# Patient Record
Sex: Female | Born: 1945
Health system: Southern US, Community
[De-identification: ages and names within clinical notes are randomized; demographics above are authoritative.]

## PROBLEM LIST (undated history)

## (undated) DIAGNOSIS — K449 Diaphragmatic hernia without obstruction or gangrene: Secondary | ICD-10-CM

## (undated) DIAGNOSIS — E785 Hyperlipidemia, unspecified: Secondary | ICD-10-CM

## (undated) DIAGNOSIS — F32A Depression, unspecified: Secondary | ICD-10-CM

## (undated) DIAGNOSIS — E039 Hypothyroidism, unspecified: Secondary | ICD-10-CM

## (undated) DIAGNOSIS — M1990 Unspecified osteoarthritis, unspecified site: Secondary | ICD-10-CM

## (undated) DIAGNOSIS — F329 Major depressive disorder, single episode, unspecified: Secondary | ICD-10-CM

## (undated) DIAGNOSIS — E079 Disorder of thyroid, unspecified: Secondary | ICD-10-CM

## (undated) HISTORY — DX: Disorder of thyroid, unspecified: E07.9

## (undated) HISTORY — DX: Major depressive disorder, single episode, unspecified: F32.9

## (undated) HISTORY — DX: Depression, unspecified: F32.A

## (undated) HISTORY — PX: TUBAL LIGATION: SHX77

## (undated) HISTORY — DX: Hyperlipidemia, unspecified: E78.5

## (undated) HISTORY — PX: NASAL SEPTUM SURGERY: SHX37

## (undated) HISTORY — PX: ROTATOR CUFF REPAIR: SHX139

---

## 1998-08-06 ENCOUNTER — Other Ambulatory Visit: Admission: RE | Admit: 1998-08-06 | Discharge: 1998-08-06 | Payer: Self-pay | Admitting: Internal Medicine

## 2000-03-16 ENCOUNTER — Encounter: Payer: Self-pay | Admitting: Internal Medicine

## 2000-03-16 ENCOUNTER — Encounter: Admission: RE | Admit: 2000-03-16 | Discharge: 2000-03-16 | Payer: Self-pay | Admitting: Internal Medicine

## 2000-06-19 ENCOUNTER — Encounter: Payer: Self-pay | Admitting: Internal Medicine

## 2000-06-19 ENCOUNTER — Encounter: Admission: RE | Admit: 2000-06-19 | Discharge: 2000-06-19 | Payer: Self-pay | Admitting: Internal Medicine

## 2001-10-27 ENCOUNTER — Emergency Department (HOSPITAL_COMMUNITY): Admission: EM | Admit: 2001-10-27 | Discharge: 2001-10-27 | Payer: Self-pay | Admitting: Emergency Medicine

## 2003-08-06 ENCOUNTER — Other Ambulatory Visit: Admission: RE | Admit: 2003-08-06 | Discharge: 2003-08-06 | Payer: Self-pay | Admitting: *Deleted

## 2004-08-10 ENCOUNTER — Other Ambulatory Visit: Admission: RE | Admit: 2004-08-10 | Discharge: 2004-08-10 | Payer: Self-pay | Admitting: Internal Medicine

## 2005-03-21 ENCOUNTER — Emergency Department (HOSPITAL_COMMUNITY): Admission: EM | Admit: 2005-03-21 | Discharge: 2005-03-21 | Payer: Self-pay | Admitting: Emergency Medicine

## 2005-10-31 ENCOUNTER — Other Ambulatory Visit: Admission: RE | Admit: 2005-10-31 | Discharge: 2005-10-31 | Payer: Self-pay | Admitting: Internal Medicine

## 2005-12-30 ENCOUNTER — Emergency Department (HOSPITAL_COMMUNITY): Admission: EM | Admit: 2005-12-30 | Discharge: 2005-12-30 | Payer: Self-pay | Admitting: Family Medicine

## 2006-03-14 ENCOUNTER — Encounter: Admission: RE | Admit: 2006-03-14 | Discharge: 2006-03-14 | Payer: Self-pay | Admitting: Specialist

## 2006-04-11 ENCOUNTER — Encounter: Admission: RE | Admit: 2006-04-11 | Discharge: 2006-04-11 | Payer: Self-pay | Admitting: Specialist

## 2006-04-25 ENCOUNTER — Encounter: Admission: RE | Admit: 2006-04-25 | Discharge: 2006-04-25 | Payer: Self-pay | Admitting: Specialist

## 2007-07-25 ENCOUNTER — Other Ambulatory Visit: Admission: RE | Admit: 2007-07-25 | Discharge: 2007-07-25 | Payer: Self-pay | Admitting: Family Medicine

## 2008-03-17 ENCOUNTER — Ambulatory Visit (HOSPITAL_COMMUNITY): Admission: RE | Admit: 2008-03-17 | Discharge: 2008-03-18 | Payer: Self-pay | Admitting: Specialist

## 2010-02-04 ENCOUNTER — Emergency Department (HOSPITAL_COMMUNITY)
Admission: EM | Admit: 2010-02-04 | Discharge: 2010-02-04 | Payer: Self-pay | Source: Home / Self Care | Admitting: Emergency Medicine

## 2010-05-24 LAB — GLUCOSE, CAPILLARY: Glucose-Capillary: 141 mg/dL — ABNORMAL HIGH (ref 70–99)

## 2010-05-24 LAB — PROTIME-INR
INR: 1 (ref 0.00–1.49)
Prothrombin Time: 12.8 seconds (ref 11.6–15.2)

## 2010-05-24 LAB — CBC
HCT: 33.4 % — ABNORMAL LOW (ref 36.0–46.0)
Hemoglobin: 12.9 g/dL (ref 12.0–15.0)
MCV: 91.9 fL (ref 78.0–100.0)
Platelets: 243 10*3/uL (ref 150–400)
RBC: 3.64 MIL/uL — ABNORMAL LOW (ref 3.87–5.11)
RDW: 13.5 % (ref 11.5–15.5)
RDW: 13.9 % (ref 11.5–15.5)
WBC: 7 10*3/uL (ref 4.0–10.5)

## 2010-05-24 LAB — APTT: aPTT: 33 seconds (ref 24–37)

## 2010-05-24 LAB — BASIC METABOLIC PANEL
CO2: 28 mEq/L (ref 19–32)
Creatinine, Ser: 0.7 mg/dL (ref 0.4–1.2)
Glucose, Bld: 99 mg/dL (ref 70–99)

## 2010-05-24 LAB — DIFFERENTIAL
Basophils Absolute: 0 10*3/uL (ref 0.0–0.1)
Eosinophils Absolute: 0.2 10*3/uL (ref 0.0–0.7)
Eosinophils Relative: 2 % (ref 0–5)
Lymphocytes Relative: 37 % (ref 12–46)
Monocytes Absolute: 0.5 10*3/uL (ref 0.1–1.0)

## 2010-06-21 NOTE — Op Note (Signed)
Megan, Craig               ACCOUNT NO.:  [id number]   MEDICAL RECORD NO.:  [id number]          PATIENT TYPE:  OIB   LOCATION:  5007                         FACILITY:  [facility]   PHYSICIAN:  [name], M.D.   DATE OF BIRTH:  1945-02-19   DATE OF PROCEDURE:  03/17/2008  DATE OF DISCHARGE:                               OPERATIVE REPORT   PREOPERATIVE DIAGNOSES:  Complete right shoulder rotator cuff tear.  Osteoarthritis, right acromioclavicular joint.   POSTOPERATIVE DIAGNOSES:  Complete rotator cuff tear, right side with  supraspinatus and superior one-third infraspinatus involved with  significant retraction of greater 2 cm.   PROCEDURE:  Open right shoulder rotator cuff tear repair using double  vested suture trough technique with 2 Mitek anchors.  Acromioplasty open  and right open distal clavicle resection.   SURGEON:  [name], MD   ASSISTANT:  [name], PA-C   ANESTHESIA:  General via orotracheal intubation by Dr. Sondra Come, anterior  scalene block performed in the preop holding area.  The patient had  local anesthetic provided at the end of the case using Marcaine 0.5%  with 1:200,000 epinephrine, 10 mL.   FINDINGS:  Degenerative joint disease, right AC joint with spur over the  distal clavicle, complete rotator cuff tear involving supraspinatus and  upper one-third of the infraspinatus tendon, attachment of the greater  tuberosity with retraction of over 2 cm.   SPECIMENS:  None.   FINDINGS:  As above.   ESTIMATED BLOOD LOSS:  50 mL.   COMPLICATIONS:  None.   The patient returned to the PACU in good condition.   HISTORY OF PRESENT ILLNESS:  The patient is a 65 year old female who in  December fell landing against her right shoulder sustaining an acute  weakness in the right upper extremity after initial treatment  conservatively with sling.  She has some return of ability to move the  arm, but persistent pain, discomfort and persistent  weakness in shoulder  abduction.  She underwent MRI scan after initial attempts were  unsuccessful in relieving her pain as she required the use of stronger  antiinflammatory agents.  MRI scan showed complete rotator cuff tear  involving supraspinatus and infraspinatus with retraction, appearance of  some chronic cuff tear as well with distal clavicle spur and AC  osteoarthritis changes.  The patient was brought to the operating room  to undergo right shoulder rotator cuff repair for acute sudden onset of  weakness associated with the injury.   INTRAOPERATIVE FINDINGS:  As above.   DESCRIPTION OF PROCEDURE:  The patient in the preop holding area had  standard protocol, marking the right arm and extremity to undergo  surgery.  She had standard preoperative antibiotics of Ancef.  Intraoperative time-out was carried out.  The patient in a beach-chair  position supine following institution of general anesthetic.  All  pressure points were well padded.  The head and the upper torso within  the Schlein shoulder frame.  The patient had PAS stockings  intraoperatively.  Time-out carried out identifying the patient,  procedure to be performed and participants.  Right upper extremity was  prepped from the wrist circumferentially about the forearm, arm,  axillary area over the superior shoulder and clavicle, over the anterior  chest wall and over the lateral aspect of the neck.  Draped in the usual  manner, iodine Vi-drape was used.  Initial incision was over the distal  clavicle and extended over the superior aspect of the acromion process  at the [place] joint approximately 2 cm in length through the skin and  subcutaneous layers directly down to the distal clavicle and AC joint.  Electrocautery then used to perform subperiosteal dissection both  anteriorly and posteriorly about the distal clavicle and then Senn  retractors flat portion, but then placed beneath the distal clavicle and  an  oscillating saw used to divide the clavicle obliquely anterior to  posterior about a centimeter to a centimeter and a half from the distal  clavicle and AC joint.  Clavicle remained stable.  Ligaments were  preserved.  With the elevation of the arm, there was no evidence or  narrowing of the joint and at this point, no significant impingement  within the [facility] joint and elevation of the arm present.  The anterior and  posterior aspects of the osteotomy were then carefully trimmed using a  rongeur.  Bone wax applied to the bleeding cancellous bone surfaces of  the distal clavicle.  Irrigation was then carried out and the periosteum  then reapproximated over the end of the clavicle and AC joint with  interrupted 0 Vicryl sutures, subcu layers approximated with interrupted  2-0 Vicryl sutures and the skin closed with the running subcu stitch of  4-0 Vicryl.  Attention then turned to the distal aspect of the acromion  process and an incision was then made over the anterior one-third of the  insertion of the deltoid muscle into the acromion process.  This  incision was carried proximally just at the anterolateral aspect of the  acromion process in line with the deltoid fibers through the skin and  subcutaneous layers approximately an inch and a half to 2 inches in  length down to the superior aspect of the acromion process, the  attachment of the deltoid tendon here and continued distally.  A 5-cm  ruler used to assess the incision ensuring the development distal to 5  cm  from the acromion process tip.  Subperiosteal dissection then  carried out over the insertion into the acromion process with the  deltoid fibers and lined with the anterior one-third raphae within the  muscle fibers.  The incision made through the fibers and subdeltoid  bursa where immediate synovial fluid was encountered.  The anterior  aspect of the acromion process was exposed and this goes out laterally  when the patient had  downsloping acromion process present.  Due to the  nature of the acromion process being type 2 without a great deal of  spurring anteriorly, anterior resection was performed of only about 5-6  mm of acromion process, anteriorly beveled proximally using oscillating  saw.  This was continued out laterally.  Then high speed bur used to bur  the undersurface of the acromion process and smoothing this area and  smoothing it laterally resecting downsloping portion of the acromion  process laterally.  A Weitlaner retractor was inserted in the  subacromial bursa, resected using Metzenbaum scissors and Mayo scissors  where necessary.  Bleeders were controlled using electrocautery.  Large  rotator cuff tear was identified.  There was a small band of  tissue  still attached to the greater trochanter extending along the  supraspinatus.  Much of the supraspinatus over its inferior aspect was  still attached about two-thirds, upper one-third was detached and  retracted posteriorly.  Supraspinatus was retracted posteriorly.  Kocher  clamps were then used to grasp the leading edge of the torn rotator cuff  and supraspinatus and infraspinatus.  This was then placed under tension  of finger dissection, then used to free up scar tissue over the  posterior aspect of the rotator cuff freeing this up and allowing for  further lengthening.  Shoulder with internal and external rotation and  identification of the biceps tendon within its normal groove identified.  At the sulcus between the greater tuberosity and the lateral aspect of  the humeral head and the trough was made using 0.75-inch curved  osteotome, the trough depth was about half of a centimeter depth.  High  speed bur was then used carefully to remove cartilaginous bone then  along the superior surface of the trough over an area of about 3-4 mm  for insertion of Mitek anchors and for making a bed of bleeding bone  surface that the rotator cuff would  reattach itself to.  This completed,  then #1 Ethibond sutures were then inserted and Tajima a modified  Kessler-like sutures of the leading edge of the torn rotator cuff.  A  total of 4 sutures were able to be placed into the supraspinatus and  infraspinatus leading edge of torn rotator cuff.  Using the concept  tray, awls were then used to make openings into the lateral aspect of  the proximal humerus about 6-7 cm below the tip of the greater  trochanter of the most anterior just lateral to the patient's bicipital  groove.  A total of 3 openings into the lateral aspect of the proximal  greater tuberosity were performed and these were placed into the trough  using Lewin-like clamp completing the connection between the small  openings of the lateral proximal humerus and the trough.  Mitek anchors  #2 were then placed using the drill, which was placed over subchondral  bone over the lateral aspect of the humeral head superiorly just medial  to the trough at about 3-4 mm.  This was placed over the leading aspect  of the area where cartilage had been removed and a subchondral bone  surface preserved.  With the rotator cuff under traction and then the  Mitek anchors sutures and #2 sutures were then passed through the  tendinous portion of the supraspinatus about a centimeter and a half  proximal to a leading edge of the torn cuff.  This was done both of the  anterior and posterior Mitek anchors and these were tagged.  At this  point, the leading edge sutures were then passed using the suture passer  the most anterior of the sutures were passed through soft tissue over  the anterior aspect of the rotator cuff capturing the ligament over the  patient's bicipital groove.  Two sutures were then passed through each  of the holes for each of the sutures provided and each of the sutures  maintained in the correct alignment laterally.  The arm was then  abducted and externally rotated and the anterior  suture placed under  tension and tied into place and the second with the suture retraction  then provided for the posterior aspect of the cuff.  This delivered the  anterior aspect of the rotator cuff torn  surface into the trough that  was made and sutured into place.  Once completion of the 4 sutures tying  was present and the last suture was passed through soft tissue and  remaining portion of the cuff posteriorly and sutured into place.  The  Mitek anchors were then each individually tied down capturing the  proximal portion of the cuff repair site in 2 areas both anterior and  posterior, one for supraspinatus and one for infraspinatus.  With this  completed then the anterior sutures over the trough distally were then  approximated to one another, sutured to each other as were the  posterior, then these were sutured to one another anterior and posterior  completing the repair laterally.  Rotation of the shoulder internal and  external rotation, abduction showed no impingement present.  The patient  did have a tight repair, though presence of an abduction pillow was  ordered.  Irrigation was then carried out and the patient then had  closure of the incision site for the acromioplasty and rotator cuff  repair by approximating the tendineus portions of the deltoid over this  acromion process using #1 Ethibond sutures.  Superficial fascial layer  of the deltoid approximated with interrupted 0 and 2-0 Vicryl sutures.  Subcu layers approximated with interrupted 2-0 Vicryl sutures and skin  closed with a running subcu stitch with 4-0 Vicryl.  Dermabond was then  applied to the skin edges above the distal clavicle resection site as  well as the area of the rotator cuff repair.  Dry dressing, 4 x 4s fixed  in place with Hypafix tape.  The patient was then placed into DonJoy  abduction pillow after infiltration of the skin site and placement of  dressing.  No good infiltration was carried out  prior to placing of  dressing and the Dermabond using 0.5% Marcaine with 1:200,000  epinephrine.  The subacromial space was also infiltrated.  After  dressing was applied and abduction pillow applied, the patient was then  returned to her bed, reactivated, extubated and returned to recovery  room in satisfactory condition.  All instruments, sponge counts were  correct.      [name], M.D.  Electronically Signed     JEN/MEDQ  D:  03/17/2008  T:  03/18/2008  Job:  [id number]

## 2012-12-31 ENCOUNTER — Ambulatory Visit: Payer: Self-pay | Admitting: Podiatry

## 2012-12-31 ENCOUNTER — Encounter: Payer: Self-pay | Admitting: Podiatry

## 2015-10-08 ENCOUNTER — Other Ambulatory Visit: Payer: Self-pay | Admitting: Gastroenterology

## 2015-10-08 DIAGNOSIS — R1013 Epigastric pain: Secondary | ICD-10-CM

## 2015-10-21 ENCOUNTER — Ambulatory Visit
Admission: RE | Admit: 2015-10-21 | Discharge: 2015-10-21 | Disposition: A | Discharge status: Home / Self Care | Payer: BLUE CROSS/BLUE SHIELD | Source: Ambulatory Visit | Attending: Gastroenterology | Admitting: Gastroenterology

## 2015-10-21 DIAGNOSIS — R1013 Epigastric pain: Secondary | ICD-10-CM

## 2016-09-14 ENCOUNTER — Encounter: Payer: Self-pay | Admitting: Podiatry

## 2016-09-14 ENCOUNTER — Ambulatory Visit (INDEPENDENT_AMBULATORY_CARE_PROVIDER_SITE_OTHER): Payer: BLUE CROSS/BLUE SHIELD

## 2016-09-14 ENCOUNTER — Ambulatory Visit (INDEPENDENT_AMBULATORY_CARE_PROVIDER_SITE_OTHER): Payer: BLUE CROSS/BLUE SHIELD | Admitting: Podiatry

## 2016-09-14 VITALS — BP 139/72 | HR 71 | Resp 16

## 2016-09-14 DIAGNOSIS — M2012 Hallux valgus (acquired), left foot: Secondary | ICD-10-CM | POA: Diagnosis not present

## 2016-09-14 DIAGNOSIS — M2042 Other hammer toe(s) (acquired), left foot: Secondary | ICD-10-CM | POA: Diagnosis not present

## 2016-09-14 DIAGNOSIS — M2011 Hallux valgus (acquired), right foot: Secondary | ICD-10-CM

## 2016-09-14 DIAGNOSIS — M2041 Other hammer toe(s) (acquired), right foot: Secondary | ICD-10-CM

## 2016-09-14 DIAGNOSIS — M205X1 Other deformities of toe(s) (acquired), right foot: Secondary | ICD-10-CM

## 2016-09-14 DIAGNOSIS — M205X2 Other deformities of toe(s) (acquired), left foot: Secondary | ICD-10-CM | POA: Diagnosis not present

## 2016-09-14 NOTE — Progress Notes (Signed)
  Subjective:  @Patient  ID: Megan Craig, female    DOB: January 12, 1946, 71 y.o.   MRN: [id number]  HPI  Chief Complaint  Patient presents with  . Toe Pain    2nd toes bilateral - left 2nd overlaps hallux, right 2nd swells and stays red, shoes irritate, previous bunion surgery on left    71 year old female with history of L Bunion and 2nd toe surgery 10 years ago (unsure of [employer]). Only has pain with the toe irritated by shoes. Reports multiple calluses to both feet for which she attempts self-care.   has a past medical history of Depression; Hyperlipidemia; and Thyroid disease.   has a past surgical history that includes Rotator cuff repair.  Current Outpatient Prescriptions:  .  buPROPion (WELLBUTRIN XL) 300 MG 24 hr tablet, Take 300 mg by mouth daily., Disp: , Rfl:  .  citalopram (CELEXA) 10 MG tablet, , Disp: , Rfl:  .  levothyroxine (SYNTHROID, LEVOTHROID) 50 MCG tablet, Take 50 mcg by mouth daily before breakfast., Disp: , Rfl:  .  raloxifene (EVISTA) 60 MG tablet, Take 60 mg by mouth daily., Disp: , Rfl:  .  simvastatin (ZOCOR) 20 MG tablet, Take 20 mg by mouth daily., Disp: , Rfl:   No Known Allergies   Review of Systems  All other systems reviewed and are negative.  Objective:   Vitals:   09/14/16 1400  BP: 139/72  Pulse: 71  Resp: 16   General AA&O x3. Normal mood and affect.  Vascular Dorsalis pedis and posterior tibial pulses 2/4 bilat. Brisk capillary refill to all digits. Pedal hair present.  Neurologic Epicritic sensation grossly intact.  Dermatologic No open lesions. Interspaces clear of maceration. Nails well groomed and normal in appearance. Hyperkeratoses to the L medial hallux IPJ, L submet 2, R 5th PIPJ  Orthopedic: MMT 5/5 in dorsiflexion, plantarflexion, inversion, and eversion. Normal joint ROM without pain or crepitus. HAV deformity bilat.  Crossover deformity 2nd toe bilat.  Lesser digital contractures bilat.   Radiographs: Taken and  reviewed. No acute fractures or dislocations. Hx of surgery L 1st metatarsal head with intact hardware and without evidence of hardware failure. Hx of likely attempted L 2nd PIPJ arthrodesis however no definite fusion noted. Increased hallux abductus angle and 1st IMA L.  Assessment & Plan:  Patient was evaluated and treated and all questions answered.  L HAV, Overlapping 2nd hammertoe; R 2nd Hammertoe. -XR reviewed as above. -Silicone toe sleeves dispensed. -Discussed possible surgical intervention if padding fails.  Return in about 4 weeks (around 10/12/2016).

## 2016-10-13 ENCOUNTER — Ambulatory Visit: Payer: BLUE CROSS/BLUE SHIELD | Admitting: Podiatry

## 2016-10-19 ENCOUNTER — Encounter: Payer: Self-pay | Admitting: Podiatry

## 2016-10-19 ENCOUNTER — Ambulatory Visit (INDEPENDENT_AMBULATORY_CARE_PROVIDER_SITE_OTHER): Payer: BLUE CROSS/BLUE SHIELD | Admitting: Podiatry

## 2016-10-19 DIAGNOSIS — M2012 Hallux valgus (acquired), left foot: Secondary | ICD-10-CM

## 2016-10-19 DIAGNOSIS — M2042 Other hammer toe(s) (acquired), left foot: Secondary | ICD-10-CM | POA: Diagnosis not present

## 2016-10-19 NOTE — Progress Notes (Signed)
She presents today to discuss surgical intervention. She states my toes are really giving if it and I really don't know what to do other than have them fixed. She states that she does want a preoperative for feet very long, she still has to work. I have reviewed her past medical history medications allergy surgery social history review of systems.  Objective: Vital signs are stable she is alert and oriented 3. Pulses are palpable. Neurologic sensorium is intact. Deep tendon reflexes are intact muscle strength is normal symmetrical bilateral. Hallux valgus deformity is noted bilaterally but worse on the left than on the right she also has a completely dislocated second toe of the left foot at the level of the metatarsophalangeal joint. She states that she like to have her bunion corrected as well as that second toe. The toe had been corrected with an arthrodesis many years ago she states. She states it is only the second toe that actually hurts the bunion doesn't bother her at all.  Assessment: Hallux valgus deformity bilateral severe overlapping hammertoe deformity or contracted digit at the level of the metatarsophalangeal joint second left.  Plan: We discussed the etiology pathology conservative versus surgical therapies. At this point we have decided to simply amputate the second toe at the level of the metatarsophalangeal joint. We discussed all of her options in great detail today and she decided on this one since it appears to be more quick and should heal more easily. We discussed all the possible postop complications which may include but are not limited to postop pain bleeding swelling infection recurrence need for further surgery overcorrection under correction chronic pain. We discussed the surgery center as well as anesthesia group and I will follow-up with her in the near future for surgical intervention.

## 2016-10-19 NOTE — Patient Instructions (Signed)
Pre-Operative Instructions  Congratulations, you have decided to take an important step towards improving your quality of life.  You can be assured that the doctors and staff at [facility] will be with you every step of the way.  Here are some important things you should know:  1. Plan to be at the surgery center/hospital at least 1 (one) hour prior to your scheduled time, unless otherwise directed by the surgical center/hospital staff.  You must have a responsible adult accompany you, remain during the surgery and drive you home.  Make sure you have directions to the surgical center/hospital to ensure you arrive on time. 2. If you are having surgery at [facility] or [facility], you will need a copy of your medical history and physical form from your family physician within one month prior to the date of surgery. We will give you a form for your primary physician to complete.  3. We make every effort to accommodate the date you request for surgery.  However, there are times where surgery dates or times have to be moved.  We will contact you as soon as possible if a change in schedule is required.   4. No aspirin/ibuprofen for one week before surgery.  If you are on aspirin, any non-steroidal anti-inflammatory medications (Mobic, Aleve, Ibuprofen) should not be taken seven (7) days prior to your surgery.  You make take Tylenol for pain prior to surgery.  5. Medications - If you are taking daily heart and blood pressure medications, seizure, reflux, allergy, asthma, anxiety, pain or diabetes medications, make sure you notify the surgery center/hospital before the day of surgery so they can tell you which medications you should take or avoid the day of surgery. 6. No food or drink after midnight the night before surgery unless directed otherwise by surgical center/hospital staff. 7. No alcoholic beverages 24-hours prior to surgery.  No smoking 24-hours prior or 24-hours after  surgery. 8. Wear loose pants or shorts. They should be loose enough to fit over bandages, boots, and casts. 9. Don't wear slip-on shoes. Sneakers are preferred. 10. Bring your boot with you to the surgery center/hospital.  Also bring crutches or a walker if your physician has prescribed it for you.  If you do not have this equipment, it will be provided for you after surgery. 11. If you have not been contacted by the surgery center/hospital by the day before your surgery, call to confirm the date and time of your surgery. 12. Leave-time from work may vary depending on the type of surgery you have.  Appropriate arrangements should be made prior to surgery with your employer. 13. Prescriptions will be provided immediately following surgery by your doctor.  Fill these as soon as possible after surgery and take the medication as directed. Pain medications will not be refilled on weekends and must be approved by the doctor. 14. Remove nail polish on the operative foot and avoid getting pedicures prior to surgery. 15. Wash the night before surgery.  The night before surgery wash the foot and leg well with water and the antibacterial soap provided. Be sure to pay special attention to beneath the toenails and in between the toes.  Wash for at least three (3) minutes. Rinse thoroughly with water and dry well with a towel.  Perform this wash unless told not to do so by your physician.  Enclosed: 1 Ice pack (please put in freezer the night before surgery)   1 Hibiclens skin cleaner     Pre-op instructions  If you have any questions regarding the instructions, please do not hesitate to call our office.  [place]: [address], [place], [place] [address] -- [phone]  [place]: [address] [place], [place] [address] -- [phone]  [place]: [address]  [place], [place] [address] -- [phone]  [place]: [address], [address], [place], [place] [address] -- [phone]  Website:  [email or web address] 

## 2016-11-17 ENCOUNTER — Telehealth: Payer: Self-pay | Admitting: *Deleted

## 2016-11-17 NOTE — Telephone Encounter (Signed)
"  I am going to change my surgery date if possible, so I need to talk to you.  Call me back on my work number.  I'll be there until 5:00."

## 2016-11-21 NOTE — Telephone Encounter (Signed)
"  I want to cancel my surgery for November 9.  I'm going to retire next year.  So I thought I would wait until I retire so I won't have to take any time off from work.  I will have it done in the Spring.  Do you except Medicare?"  Yes, we accept Medicare.  "I don't have to see him anymore prior to having the surgery, do I?"  Yes, you will need to see Dr. Al Corpus prior to scheduling surgery again.  I called Aram Beecham at [facility] and canceled the surgery.

## 2017-02-22 DIAGNOSIS — L570 Actinic keratosis: Secondary | ICD-10-CM | POA: Diagnosis not present

## 2017-03-16 DIAGNOSIS — M7581 Other shoulder lesions, right shoulder: Secondary | ICD-10-CM | POA: Diagnosis not present

## 2017-03-16 DIAGNOSIS — Z9889 Other specified postprocedural states: Secondary | ICD-10-CM | POA: Diagnosis not present

## 2017-03-16 DIAGNOSIS — M25511 Pain in right shoulder: Secondary | ICD-10-CM | POA: Diagnosis not present

## 2017-03-16 DIAGNOSIS — M75101 Unspecified rotator cuff tear or rupture of right shoulder, not specified as traumatic: Secondary | ICD-10-CM | POA: Insufficient documentation

## 2017-04-05 DIAGNOSIS — L578 Other skin changes due to chronic exposure to nonionizing radiation: Secondary | ICD-10-CM | POA: Diagnosis not present

## 2017-06-04 ENCOUNTER — Other Ambulatory Visit: Payer: Self-pay | Admitting: Gastroenterology

## 2017-06-04 DIAGNOSIS — R1012 Left upper quadrant pain: Secondary | ICD-10-CM

## 2017-06-04 DIAGNOSIS — Z1159 Encounter for screening for other viral diseases: Secondary | ICD-10-CM | POA: Diagnosis not present

## 2017-06-04 DIAGNOSIS — E7800 Pure hypercholesterolemia, unspecified: Secondary | ICD-10-CM | POA: Diagnosis not present

## 2017-06-04 DIAGNOSIS — F339 Major depressive disorder, recurrent, unspecified: Secondary | ICD-10-CM | POA: Diagnosis not present

## 2017-06-04 DIAGNOSIS — R1011 Right upper quadrant pain: Secondary | ICD-10-CM | POA: Diagnosis not present

## 2017-06-04 DIAGNOSIS — M8580 Other specified disorders of bone density and structure, unspecified site: Secondary | ICD-10-CM | POA: Diagnosis not present

## 2017-06-04 DIAGNOSIS — E039 Hypothyroidism, unspecified: Secondary | ICD-10-CM | POA: Diagnosis not present

## 2017-06-04 DIAGNOSIS — Z0000 Encounter for general adult medical examination without abnormal findings: Secondary | ICD-10-CM | POA: Diagnosis not present

## 2017-06-11 ENCOUNTER — Ambulatory Visit
Admission: RE | Admit: 2017-06-11 | Discharge: 2017-06-11 | Disposition: A | Discharge status: Home / Self Care | Payer: Self-pay | Source: Ambulatory Visit | Attending: Gastroenterology | Admitting: Gastroenterology

## 2017-06-11 DIAGNOSIS — R1012 Left upper quadrant pain: Secondary | ICD-10-CM

## 2017-06-11 DIAGNOSIS — R1011 Right upper quadrant pain: Secondary | ICD-10-CM | POA: Diagnosis not present

## 2017-06-14 ENCOUNTER — Other Ambulatory Visit (HOSPITAL_COMMUNITY): Payer: Self-pay | Admitting: Gastroenterology

## 2017-06-14 DIAGNOSIS — R1012 Left upper quadrant pain: Secondary | ICD-10-CM

## 2017-06-18 DIAGNOSIS — H52223 Regular astigmatism, bilateral: Secondary | ICD-10-CM | POA: Diagnosis not present

## 2017-06-18 DIAGNOSIS — H40053 Ocular hypertension, bilateral: Secondary | ICD-10-CM | POA: Diagnosis not present

## 2017-06-18 DIAGNOSIS — H5202 Hypermetropia, left eye: Secondary | ICD-10-CM | POA: Diagnosis not present

## 2017-06-18 DIAGNOSIS — H524 Presbyopia: Secondary | ICD-10-CM | POA: Diagnosis not present

## 2017-06-27 ENCOUNTER — Ambulatory Visit (HOSPITAL_COMMUNITY)
Admission: RE | Admit: 2017-06-27 | Discharge: 2017-06-27 | Disposition: A | Discharge status: Home / Self Care | Payer: Medicare Other | Source: Ambulatory Visit | Attending: Gastroenterology | Admitting: Gastroenterology

## 2017-06-27 ENCOUNTER — Encounter (HOSPITAL_COMMUNITY): Payer: Self-pay | Admitting: Radiology

## 2017-06-27 DIAGNOSIS — R1012 Left upper quadrant pain: Secondary | ICD-10-CM | POA: Insufficient documentation

## 2017-06-27 DIAGNOSIS — R1010 Upper abdominal pain, unspecified: Secondary | ICD-10-CM | POA: Diagnosis not present

## 2017-06-27 MED ORDER — TECHNETIUM TC 99M MEBROFENIN IV KIT
5.3000 | PACK | Freq: Once | INTRAVENOUS | Status: AC | PRN
  Administered 2017-06-27: 5.3 via INTRAVENOUS

## 2017-07-12 DIAGNOSIS — H10022 Other mucopurulent conjunctivitis, left eye: Secondary | ICD-10-CM | POA: Diagnosis not present

## 2017-07-16 DIAGNOSIS — M8588 Other specified disorders of bone density and structure, other site: Secondary | ICD-10-CM | POA: Diagnosis not present

## 2017-07-20 DIAGNOSIS — S0502XA Injury of conjunctiva and corneal abrasion without foreign body, left eye, initial encounter: Secondary | ICD-10-CM | POA: Diagnosis not present

## 2017-10-01 DIAGNOSIS — Z1231 Encounter for screening mammogram for malignant neoplasm of breast: Secondary | ICD-10-CM | POA: Diagnosis not present

## 2017-11-19 DIAGNOSIS — R1314 Dysphagia, pharyngoesophageal phase: Secondary | ICD-10-CM | POA: Diagnosis not present

## 2017-11-19 DIAGNOSIS — R1013 Epigastric pain: Secondary | ICD-10-CM | POA: Diagnosis not present

## 2017-11-21 DIAGNOSIS — Z23 Encounter for immunization: Secondary | ICD-10-CM | POA: Diagnosis not present

## 2017-11-30 DIAGNOSIS — K222 Esophageal obstruction: Secondary | ICD-10-CM | POA: Diagnosis not present

## 2017-11-30 DIAGNOSIS — K219 Gastro-esophageal reflux disease without esophagitis: Secondary | ICD-10-CM | POA: Diagnosis not present

## 2017-11-30 DIAGNOSIS — R1310 Dysphagia, unspecified: Secondary | ICD-10-CM | POA: Diagnosis not present

## 2017-11-30 DIAGNOSIS — K3189 Other diseases of stomach and duodenum: Secondary | ICD-10-CM | POA: Diagnosis not present

## 2017-11-30 DIAGNOSIS — R1013 Epigastric pain: Secondary | ICD-10-CM | POA: Diagnosis not present

## 2017-11-30 DIAGNOSIS — K449 Diaphragmatic hernia without obstruction or gangrene: Secondary | ICD-10-CM | POA: Diagnosis not present

## 2017-11-30 DIAGNOSIS — K2930 Chronic superficial gastritis without bleeding: Secondary | ICD-10-CM | POA: Diagnosis not present

## 2017-12-04 DIAGNOSIS — F339 Major depressive disorder, recurrent, unspecified: Secondary | ICD-10-CM | POA: Diagnosis not present

## 2017-12-04 DIAGNOSIS — E7800 Pure hypercholesterolemia, unspecified: Secondary | ICD-10-CM | POA: Diagnosis not present

## 2017-12-04 DIAGNOSIS — E039 Hypothyroidism, unspecified: Secondary | ICD-10-CM | POA: Diagnosis not present

## 2017-12-04 DIAGNOSIS — M8580 Other specified disorders of bone density and structure, unspecified site: Secondary | ICD-10-CM | POA: Diagnosis not present

## 2017-12-05 DIAGNOSIS — K219 Gastro-esophageal reflux disease without esophagitis: Secondary | ICD-10-CM | POA: Diagnosis not present

## 2017-12-05 DIAGNOSIS — K3189 Other diseases of stomach and duodenum: Secondary | ICD-10-CM | POA: Diagnosis not present

## 2017-12-17 DIAGNOSIS — H40013 Open angle with borderline findings, low risk, bilateral: Secondary | ICD-10-CM | POA: Diagnosis not present

## 2017-12-17 DIAGNOSIS — H40052 Ocular hypertension, left eye: Secondary | ICD-10-CM | POA: Diagnosis not present

## 2018-01-02 DIAGNOSIS — K449 Diaphragmatic hernia without obstruction or gangrene: Secondary | ICD-10-CM | POA: Diagnosis not present

## 2018-01-08 ENCOUNTER — Other Ambulatory Visit: Payer: Self-pay | Admitting: Surgery

## 2018-01-08 DIAGNOSIS — K449 Diaphragmatic hernia without obstruction or gangrene: Secondary | ICD-10-CM

## 2018-01-10 ENCOUNTER — Ambulatory Visit
Admission: RE | Admit: 2018-01-10 | Discharge: 2018-01-10 | Disposition: A | Discharge status: Home / Self Care | Payer: Medicare Other | Source: Ambulatory Visit | Attending: Surgery | Admitting: Surgery

## 2018-01-10 DIAGNOSIS — K449 Diaphragmatic hernia without obstruction or gangrene: Secondary | ICD-10-CM

## 2018-01-10 DIAGNOSIS — K219 Gastro-esophageal reflux disease without esophagitis: Secondary | ICD-10-CM | POA: Diagnosis not present

## 2018-01-22 ENCOUNTER — Ambulatory Visit: Payer: Self-pay | Admitting: Surgery

## 2018-01-22 NOTE — H&P (View-Only) (Signed)
Surgical H&P  CC: paraesophageal hernia  HPI: This is a very pleasant 72 year old woman with a history of hyperlipidemia, thyroid disease and depression, abdominal surgery only notable for tubal ligation many years ago, who is referred by Dr. Marca Ancona for hiatal hernia. Patient states that she has had about this for many years, she states that it was initially a 5 cm hernia that was noted at time of upper endoscopy which was done her colonoscopy many years ago. Her symptoms include epigastric and retrosternal pain which is postprandial, feels like trapped gas, is accompanied by frequent belching and occasional emesis of undigested food. The pain radiates around both sides to her back. She denies any dysphagia or odynophagia. Denies any unintentional weight loss. 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 She states her bowel movements are regular. She denies any reflux or GERD symptoms. She states that she hasn't had much pain or symptoms since Dr. Marca Ancona performed her endoscopy.  No Known Allergies  Past Medical History:  Diagnosis Date  . Depression   . Hyperlipidemia   . Thyroid disease     No family history on file.  Social History   Socioeconomic History  . Marital status: Married    Spouse name: Not on file  . Number of children: Not on file  . Years of education: Not on file  . Highest education level: Not on file  Occupational History  . Not on file  Social Needs  . Financial resource strain: Not on file  . Food insecurity:    Worry: Not on file    Inability: Not on file  . Transportation needs:    Medical: Not on file    Non-medical: Not on file  Tobacco Use  . Smoking status: Never Smoker  . Smokeless tobacco: Never Used  Substance and Sexual Activity  . Alcohol use: No  . Drug use: No  . Sexual activity: Not on file  Lifestyle  . Physical activity:    Days  per week: Not on file    Minutes per session: Not on file  . Stress: Not on file  Relationships  . Social connections:    Talks on phone: Not on file    Gets together: Not on file    Attends religious service: Not on file    Active member of club or organization: Not on file    Attends meetings of clubs or organizations: Not on file    Relationship status: Not on file  Other Topics Concern  . Not on file  Social History Narrative  . Not on file    Current Outpatient Medications on File Prior to Visit  Medication Sig Dispense Refill  . buPROPion (WELLBUTRIN XL) 300 MG 24 hr tablet Take 300 mg by mouth daily.    . citalopram (CELEXA) 10 MG tablet     . levothyroxine (SYNTHROID, LEVOTHROID) 50 MCG tablet Take 50 mcg by mouth daily before breakfast.    . raloxifene (EVISTA) 60 MG tablet Take 60 mg by mouth daily.    . simvastatin (ZOCOR) 20 MG tablet Take 20 mg by mouth daily.     No current facility-administered medications on file prior to visit.    General Not Present- Appetite Loss, Chills, Fatigue, Fever, Night Sweats, Weight Gain and Weight Loss. Skin Not Present- Change in Wart/Mole, Dryness, Hives, Jaundice, New Lesions, Non-Healing Wounds, Rash and Ulcer.  HEENT Present- Seasonal Allergies. Not Present- Earache, Hearing Loss, Hoarseness, Nose Bleed, Oral Ulcers, Ringing in the Ears, Sinus Pain, Sore Throat, Visual Disturbances, Wears glasses/contact lenses and Yellow Eyes. Respiratory Not Present- Bloody sputum, Chronic Cough, Difficulty Breathing, Snoring and Wheezing. Breast Not Present- Breast Mass, Breast Pain, Nipple Discharge and Skin Changes. Cardiovascular Not Present- Chest Pain, Difficulty Breathing Lying Down, Leg Cramps, Palpitations, Rapid Heart Rate, Shortness of Breath and Swelling of Extremities. Gastrointestinal Present- Gets full quickly at meals. Not Present- Abdominal Pain, Bloating, Bloody Stool, Change in Bowel Habits, Chronic diarrhea, Constipation,  Difficulty Swallowing, Excessive gas, Hemorrhoids, Indigestion, Nausea, Rectal Pain and Vomiting. Female Genitourinary Not Present- Frequency, Nocturia, Painful Urination, Pelvic Pain and Urgency. Musculoskeletal Not Present- Back Pain, Joint Pain, Joint Stiffness, Muscle Pain, Muscle Weakness and Swelling of Extremities. Neurological Not Present- Decreased Memory, Fainting, Headaches, Numbness, Seizures, Tingling, Tremor, Trouble walking and Weakness. Psychiatric Not Present- Anxiety, Bipolar, Change in Sleep Pattern, Depression, Fearful and Frequent crying. Endocrine Not Present- Cold Intolerance, Excessive Hunger, Hair Changes, Heat Intolerance, Hot flashes and New Diabetes. Hematology Not Present- Blood Thinners, Easy Bruising, Excessive bleeding, Gland problems, HIV and Persistent Infections. All other systems negative  Physical Exam: 01/02/2018 2:46 PM Weight: 118.8 lb Height: 60in Height was reported by patient. Body Surface Area: 1.5 m Body Mass Index: 23.2 kg/m  Temp.: 98.36F  Pulse: 91 (Regular)  BP: 124/78 (Sitting, Left Arm, Standard)  Gen: alert and well appearing Eye: extraocular motion intact, no scleral icterus ENT: moist mucus membranes, dentition intact Neck: no mass or thyromegaly Chest: unlabored respirations, symmetrical air entry, clear bilaterally CV: regular rate and rhythm, no pedal edema Abdomen: soft, nontender, nondistended. No mass or organomegaly MSK: strength symmetrical throughout, no deformity Neuro: grossly intact, normal gait Psych: normal mood and affect, appropriate insight Skin: warm and dry, no rash or lesion on limited exam   CBC Latest Ref Rng & Units 03/18/2008 03/16/2008  WBC 4.0 - 10.5 K/uL 11.0(H) 7.0  Hemoglobin 12.0 - 15.0 g/dL 11.4(L) 12.9  Hematocrit 36.0 - 46.0 % 33.4(L) 37.5  Platelets 150 - 400 K/uL 243 281    CMP Latest Ref Rng & Units 03/16/2008  Glucose 70 - 99 mg/dL 99  BUN 6 - 23 mg/dL 17  Creatinine 0.4 - 1.2  mg/dL 1.61  Sodium 096 - 045 mEq/L 137  Potassium 3.5 - 5.1 mEq/L 3.6  Chloride 96 - 112 mEq/L 101  CO2 19 - 32 mEq/L 28  Calcium 8.4 - 10.5 mg/dL 9.9    Lab Results  Component Value Date   INR 1.0 03/16/2008    Imaging: CLINICAL DATA:  History of hiatal hernia, evaluate size and location  EXAM: UPPER GI SERIES WITH KUB  TECHNIQUE: After obtaining a scout radiograph a routine upper GI series was performed using thin and high density barium.  FLUOROSCOPY TIME:  Fluoroscopy Time:  2 minutes 6 seconds  Radiation Exposure Index (if provided by the fluoroscopic device): 109 mGy  Number of Acquired Spot Images: 0  COMPARISON:  Ultrasound of the abdomen 06/11/2017  FINDINGS: A preliminary film of the abdomen shows a nonspecific bowel gas pattern. No opaque calculi are seen. The bones are unremarkable.  Initially, a double-contrast barium swallow was performed. There are mild tertiary contractions within the mid and distal esophagus. There is a moderate paraesophageal hernia present. There is also persistent narrowing of the distal esophagus just above the paraesophageal hernia consistent with stricture.  The stomach coats well with barium and is well distended with  air. Other than the moderately large paraesophageal hernia, no abnormality of the stomach is seen. The duodenal bulb fills well and the duodenal loop is in normal position.  With the water siphon maneuver, moderate gastroesophageal reflux is demonstrated. A barium pill was given at the end of the study which did lodge in the distal esophagus consistent with a distal esophageal stricture.  IMPRESSION: 1. Moderately large paraesophageal hernia with moderate gastroesophageal reflux. 2. Barium pill does lodge within the distal esophagus consistent with short segment distal esophageal stricture. 3. Mild tertiary contractions. 4. No abnormality other than the described paraesophageal  hernia involving the stomach or duodenum.   Electronically Signed   By: Dwyane DeePaul  Barry M.D.   On: 01/10/2018 09:53   A/P: HIATAL HERNIA (K44.9) Story: She has had a thorough workup of her gallbladder including ultrasound and HIDA which are both negative. I had a long discussion with her about the anatomy of paraesophageal hernia and the technique of laparoscopic repair including Nissen fundoplication. Diagram was used to demonstrate the relevant anatomy. We discussed the risks of surgery including bleeding, infection, pain, scarring, injury to intra-abdominal or mediastinal structures including the esophagus, risk of hernia recurrence, Nissen slip/dehiscence/edema which may cause temporary or permanent dysphagia that may require further procedures or surgeries, risk of gas bloat syndrome, as well as risks of DVT/PE, pneumonia, stroke, heart attack, death. We discussed the alternative option of ongoing observation and management of symptoms with medications and lifestyle changes but I quoted her about a 15% chance that she will continue to have symptoms from this and ultimately return for repair. At this point she would like to proceed with scheduling surgery We will request the endoscopy report from Dr. Laurey MoraleKarki's office and I would like her to have an upper GI as well for surgical planning.   Phylliss Blakeshelsea Eural Holzschuh, MD [facility] [place] Pager [phone]

## 2018-01-22 NOTE — H&P (Signed)
Surgical H&P  CC: paraesophageal hernia  HPI: This is a very pleasant 72 year old woman with a history of hyperlipidemia, thyroid disease and depression, abdominal surgery only notable for tubal ligation many years ago, who is referred by Dr. Marca Ancona for hiatal hernia. Patient states that she has had about this for many years, she states that it was initially a 5 cm hernia that was noted at time of upper endoscopy which was done her colonoscopy many years ago. Her symptoms include epigastric and retrosternal pain which is postprandial, feels like trapped gas, is accompanied by frequent belching and occasional emesis of undigested food. The pain radiates around both sides to her back. She denies any dysphagia or odynophagia. Denies any unintentional weight loss. 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 She states her bowel movements are regular. She denies any reflux or GERD symptoms. She states that she hasn't had much pain or symptoms since Dr. Marca Ancona performed her endoscopy.  No Known Allergies  Past Medical History:  Diagnosis Date  . Depression   . Hyperlipidemia   . Thyroid disease     No family history on file.  Social History   Socioeconomic History  . Marital status: Married    Spouse name: Not on file  . Number of children: Not on file  . Years of education: Not on file  . Highest education level: Not on file  Occupational History  . Not on file  Social Needs  . Financial resource strain: Not on file  . Food insecurity:    Worry: Not on file    Inability: Not on file  . Transportation needs:    Medical: Not on file    Non-medical: Not on file  Tobacco Use  . Smoking status: Never Smoker  . Smokeless tobacco: Never Used  Substance and Sexual Activity  . Alcohol use: No  . Drug use: No  . Sexual activity: Not on file  Lifestyle  . Physical activity:    Days  per week: Not on file    Minutes per session: Not on file  . Stress: Not on file  Relationships  . Social connections:    Talks on phone: Not on file    Gets together: Not on file    Attends religious service: Not on file    Active member of club or organization: Not on file    Attends meetings of clubs or organizations: Not on file    Relationship status: Not on file  Other Topics Concern  . Not on file  Social History Narrative  . Not on file    Current Outpatient Medications on File Prior to Visit  Medication Sig Dispense Refill  . buPROPion (WELLBUTRIN XL) 300 MG 24 hr tablet Take 300 mg by mouth daily.    . citalopram (CELEXA) 10 MG tablet     . levothyroxine (SYNTHROID, LEVOTHROID) 50 MCG tablet Take 50 mcg by mouth daily before breakfast.    . raloxifene (EVISTA) 60 MG tablet Take 60 mg by mouth daily.    . simvastatin (ZOCOR) 20 MG tablet Take 20 mg by mouth daily.     No current facility-administered medications on file prior to visit.    General Not Present- Appetite Loss, Chills, Fatigue, Fever, Night Sweats, Weight Gain and Weight Loss. Skin Not Present- Change in Wart/Mole, Dryness, Hives, Jaundice, New Lesions, Non-Healing Wounds, Rash and Ulcer.  HEENT Present- Seasonal Allergies. Not Present- Earache, Hearing Loss, Hoarseness, Nose Bleed, Oral Ulcers, Ringing in the Ears, Sinus Pain, Sore Throat, Visual Disturbances, Wears glasses/contact lenses and Yellow Eyes. Respiratory Not Present- Bloody sputum, Chronic Cough, Difficulty Breathing, Snoring and Wheezing. Breast Not Present- Breast Mass, Breast Pain, Nipple Discharge and Skin Changes. Cardiovascular Not Present- Chest Pain, Difficulty Breathing Lying Down, Leg Cramps, Palpitations, Rapid Heart Rate, Shortness of Breath and Swelling of Extremities. Gastrointestinal Present- Gets full quickly at meals. Not Present- Abdominal Pain, Bloating, Bloody Stool, Change in Bowel Habits, Chronic diarrhea, Constipation,  Difficulty Swallowing, Excessive gas, Hemorrhoids, Indigestion, Nausea, Rectal Pain and Vomiting. Female Genitourinary Not Present- Frequency, Nocturia, Painful Urination, Pelvic Pain and Urgency. Musculoskeletal Not Present- Back Pain, Joint Pain, Joint Stiffness, Muscle Pain, Muscle Weakness and Swelling of Extremities. Neurological Not Present- Decreased Memory, Fainting, Headaches, Numbness, Seizures, Tingling, Tremor, Trouble walking and Weakness. Psychiatric Not Present- Anxiety, Bipolar, Change in Sleep Pattern, Depression, Fearful and Frequent crying. Endocrine Not Present- Cold Intolerance, Excessive Hunger, Hair Changes, Heat Intolerance, Hot flashes and New Diabetes. Hematology Not Present- Blood Thinners, Easy Bruising, Excessive bleeding, Gland problems, HIV and Persistent Infections. All other systems negative  Physical Exam: 01/02/2018 2:46 PM Weight: 118.8 lb Height: 60in Height was reported by patient. Body Surface Area: 1.5 m Body Mass Index: 23.2 kg/m  Temp.: 98.22F  Pulse: 91 (Regular)  BP: 124/78 (Sitting, Left Arm, Standard)  Gen: alert and well appearing Eye: extraocular motion intact, no scleral icterus ENT: moist mucus membranes, dentition intact Neck: no mass or thyromegaly Chest: unlabored respirations, symmetrical air entry, clear bilaterally CV: regular rate and rhythm, no pedal edema Abdomen: soft, nontender, nondistended. No mass or organomegaly MSK: strength symmetrical throughout, no deformity Neuro: grossly intact, normal gait Psych: normal mood and affect, appropriate insight Skin: warm and dry, no rash or lesion on limited exam   CBC Latest Ref Rng & Units 03/18/2008 03/16/2008  WBC 4.0 - 10.5 K/uL 11.0(H) 7.0  Hemoglobin 12.0 - 15.0 g/dL 11.4(L) 12.9  Hematocrit 36.0 - 46.0 % 33.4(L) 37.5  Platelets 150 - 400 K/uL 243 281    CMP Latest Ref Rng & Units 03/16/2008  Glucose 70 - 99 mg/dL 99  BUN 6 - 23 mg/dL 17  Creatinine 0.4 - 1.2  mg/dL 1.61  Sodium 096 - 045 mEq/L 137  Potassium 3.5 - 5.1 mEq/L 3.6  Chloride 96 - 112 mEq/L 101  CO2 19 - 32 mEq/L 28  Calcium 8.4 - 10.5 mg/dL 9.9    Lab Results  Component Value Date   INR 1.0 03/16/2008    Imaging: CLINICAL DATA:  History of hiatal hernia, evaluate size and location  EXAM: UPPER GI SERIES WITH KUB  TECHNIQUE: After obtaining a scout radiograph a routine upper GI series was performed using thin and high density barium.  FLUOROSCOPY TIME:  Fluoroscopy Time:  2 minutes 6 seconds  Radiation Exposure Index (if provided by the fluoroscopic device): 109 mGy  Number of Acquired Spot Images: 0  COMPARISON:  Ultrasound of the abdomen 06/11/2017  FINDINGS: A preliminary film of the abdomen shows a nonspecific bowel gas pattern. No opaque calculi are seen. The bones are unremarkable.  Initially, a double-contrast barium swallow was performed. There are mild tertiary contractions within the mid and distal esophagus. There is a moderate paraesophageal hernia present. There is also persistent narrowing of the distal esophagus just above the paraesophageal hernia consistent with stricture.  The stomach coats well with barium and is well distended with  air. Other than the moderately large paraesophageal hernia, no abnormality of the stomach is seen. The duodenal bulb fills well and the duodenal loop is in normal position.  With the water siphon maneuver, moderate gastroesophageal reflux is demonstrated. A barium pill was given at the end of the study which did lodge in the distal esophagus consistent with a distal esophageal stricture.  IMPRESSION: 1. Moderately large paraesophageal hernia with moderate gastroesophageal reflux. 2. Barium pill does lodge within the distal esophagus consistent with short segment distal esophageal stricture. 3. Mild tertiary contractions. 4. No abnormality other than the described paraesophageal  hernia involving the stomach or duodenum.   Electronically Signed   By: Dwyane DeePaul  Barry M.D.   On: 01/10/2018 09:53   A/P: HIATAL HERNIA (K44.9) Story: She has had a thorough workup of her gallbladder including ultrasound and HIDA which are both negative. I had a long discussion with her about the anatomy of paraesophageal hernia and the technique of laparoscopic repair including Nissen fundoplication. Diagram was used to demonstrate the relevant anatomy. We discussed the risks of surgery including bleeding, infection, pain, scarring, injury to intra-abdominal or mediastinal structures including the esophagus, risk of hernia recurrence, Nissen slip/dehiscence/edema which may cause temporary or permanent dysphagia that may require further procedures or surgeries, risk of gas bloat syndrome, as well as risks of DVT/PE, pneumonia, stroke, heart attack, death. We discussed the alternative option of ongoing observation and management of symptoms with medications and lifestyle changes but I quoted her about a 15% chance that she will continue to have symptoms from this and ultimately return for repair. At this point she would like to proceed with scheduling surgery We will request the endoscopy report from Dr. Laurey MoraleKarki's office and I would like her to have an upper GI as well for surgical planning.   Phylliss Blakeshelsea Tyrianna Lightle, MD [facility] [place] Pager [phone]

## 2018-02-13 NOTE — Patient Instructions (Signed)
Megan Craig  02/13/2018   Your procedure is scheduled on: Monday 02/18/2018  Report to [facility] Main  Entrance              Report to admitting at  0530  AM    Call this number if you have problems the morning of surgery [phone]    Remember: Do not eat food or drink liquids :After Midnight.              BRUSH YOUR TEETH MORNING OF SURGERY AND RINSE YOUR MOUTH OUT, NO CHEWING GUM CANDY OR MINTS.     Take these medicines the morning of surgery with A SIP OF WATER: Bupropion (Wellbutrin XL), Citalopram (Celexa), Levothyroxine (Synthroid), Simvastatin (Zocor)                                You may not have any metal on your body including hair pins and              piercings  Do not wear jewelry, make-up, lotions, powders or perfumes, deodorant             Do not wear nail polish.  Do not shave  48 hours prior to surgery.                Do not bring valuables to the hospital. [facility] IS NOT             RESPONSIBLE   FOR VALUABLES.  Contacts, dentures or bridgework may not be worn into surgery.  Leave suitcase in the car. After surgery it may be brought to your room.                   Please read over the following fact sheets you were given: _____________________________________________________________________             [facility] - Preparing for Surgery Before surgery, you can play an important role.  Because skin is not sterile, your skin needs to be as free of germs as possible.  You can reduce the number of germs on your skin by washing with CHG (chlorahexidine gluconate) soap before surgery.  CHG is an antiseptic cleaner which kills germs and bonds with the skin to continue killing germs even after washing. Please DO NOT use if you have an allergy to CHG or antibacterial soaps.  If your skin becomes reddened/irritated stop using the CHG and inform your nurse when you arrive at Short Stay. Do not shave (including legs and underarms)  for at least 48 hours prior to the first CHG shower.  You may shave your face/neck. Please follow these instructions carefully:  1.  Shower with CHG Soap the night before surgery and the  morning of Surgery.  2.  If you choose to wash your hair, wash your hair first as usual with your  normal  shampoo.  3.  After you shampoo, rinse your hair and body thoroughly to remove the  shampoo.                           4.  Use CHG as you would any other liquid soap.  You can apply chg directly  to the skin and wash  Gently with a scrungie or clean washcloth.  5.  Apply the CHG Soap to your body ONLY FROM THE NECK DOWN.   Do not use on face/ open                           Wound or open sores. Avoid contact with eyes, ears mouth and genitals (private parts).                       Wash face,  Genitals (private parts) with your normal soap.             6.  Wash thoroughly, paying special attention to the area where your surgery  will be performed.  7.  Thoroughly rinse your body with warm water from the neck down.  8.  DO NOT shower/wash with your normal soap after using and rinsing off  the CHG Soap.                9.  Pat yourself dry with a clean towel.            10.  Wear clean pajamas.            11.  Place clean sheets on your bed the night of your first shower and do not  sleep with pets. Day of Surgery : Do not apply any lotions/deodorants the morning of surgery.  Please wear clean clothes to the hospital/surgery center.  FAILURE TO FOLLOW THESE INSTRUCTIONS MAY RESULT IN THE CANCELLATION OF YOUR SURGERY PATIENT SIGNATURE_________________________________  NURSE SIGNATURE__________________________________  ________________________________________________________________________

## 2018-02-14 ENCOUNTER — Ambulatory Visit (HOSPITAL_COMMUNITY)
Admission: RE | Admit: 2018-02-14 | Discharge: 2018-02-14 | Disposition: A | Discharge status: Home / Self Care | Payer: Medicare HMO | Source: Ambulatory Visit | Attending: Surgery | Admitting: Surgery

## 2018-02-14 ENCOUNTER — Encounter (HOSPITAL_COMMUNITY): Payer: Self-pay

## 2018-02-14 ENCOUNTER — Other Ambulatory Visit: Payer: Self-pay

## 2018-02-14 ENCOUNTER — Encounter (HOSPITAL_COMMUNITY)
Admission: RE | Admit: 2018-02-14 | Discharge: 2018-02-14 | Disposition: A | Discharge status: Home / Self Care | Payer: Medicare HMO | Source: Ambulatory Visit | Attending: Surgery | Admitting: Surgery

## 2018-02-14 DIAGNOSIS — K449 Diaphragmatic hernia without obstruction or gangrene: Secondary | ICD-10-CM | POA: Diagnosis not present

## 2018-02-14 DIAGNOSIS — Z01810 Encounter for preprocedural cardiovascular examination: Secondary | ICD-10-CM | POA: Diagnosis not present

## 2018-02-14 HISTORY — DX: Diaphragmatic hernia without obstruction or gangrene: K44.9

## 2018-02-14 HISTORY — DX: Hypothyroidism, unspecified: E03.9

## 2018-02-14 HISTORY — DX: Unspecified osteoarthritis, unspecified site: M19.90

## 2018-02-14 LAB — CBC WITH DIFFERENTIAL/PLATELET
Abs Immature Granulocytes: 0.01 10*3/uL (ref 0.00–0.07)
Basophils Absolute: 0 10*3/uL (ref 0.0–0.1)
Basophils Relative: 0 %
Eosinophils Absolute: 0.1 10*3/uL (ref 0.0–0.5)
Eosinophils Relative: 2 %
HEMATOCRIT: 40.5 % (ref 36.0–46.0)
Hemoglobin: 12.7 g/dL (ref 12.0–15.0)
Immature Granulocytes: 0 %
LYMPHS ABS: 1.7 10*3/uL (ref 0.7–4.0)
Lymphocytes Relative: 29 %
MCH: 30.1 pg (ref 26.0–34.0)
MCHC: 31.4 g/dL (ref 30.0–36.0)
MCV: 96 fL (ref 80.0–100.0)
MONOS PCT: 10 %
Monocytes Absolute: 0.6 10*3/uL (ref 0.1–1.0)
Neutro Abs: 3.6 10*3/uL (ref 1.7–7.7)
Neutrophils Relative %: 59 %
Platelets: 271 10*3/uL (ref 150–400)
RBC: 4.22 MIL/uL (ref 3.87–5.11)
RDW: 14 % (ref 11.5–15.5)
WBC: 6.1 10*3/uL (ref 4.0–10.5)
nRBC: 0 % (ref 0.0–0.2)

## 2018-02-14 LAB — COMPREHENSIVE METABOLIC PANEL
ALT: 24 U/L (ref 0–44)
AST: 24 U/L (ref 15–41)
Albumin: 4 g/dL (ref 3.5–5.0)
Alkaline Phosphatase: 51 U/L (ref 38–126)
Anion gap: 5 (ref 5–15)
BILIRUBIN TOTAL: 0.7 mg/dL (ref 0.3–1.2)
BUN: 18 mg/dL (ref 8–23)
CO2: 28 mmol/L (ref 22–32)
Calcium: 10.2 mg/dL (ref 8.9–10.3)
Chloride: 105 mmol/L (ref 98–111)
Creatinine, Ser: 0.81 mg/dL (ref 0.44–1.00)
GFR calc Af Amer: >60 mL/min (ref >60)
GFR calc non Af Amer: >60 mL/min (ref >60)
Glucose, Bld: 98 mg/dL (ref 70–99)
POTASSIUM: 4 mmol/L (ref 3.5–5.1)
Sodium: 138 mmol/L (ref 135–145)
Total Protein: 6.7 g/dL (ref 6.5–8.1)

## 2018-02-17 MED ORDER — BUPIVACAINE LIPOSOME 1.3 % IJ SUSP
20.0000 mL | Freq: Once | INTRAMUSCULAR | Status: DC
  Filled 2018-02-17: qty 20

## 2018-02-18 ENCOUNTER — Ambulatory Visit (HOSPITAL_COMMUNITY): Payer: Medicare HMO | Admitting: Registered Nurse

## 2018-02-18 ENCOUNTER — Ambulatory Visit (HOSPITAL_COMMUNITY): Payer: Medicare HMO | Admitting: Physician Assistant

## 2018-02-18 ENCOUNTER — Encounter (HOSPITAL_COMMUNITY): Payer: Self-pay

## 2018-02-18 ENCOUNTER — Observation Stay (HOSPITAL_COMMUNITY)
Admission: RE | Admit: 2018-02-18 | Discharge: 2018-02-19 | Disposition: A | Discharge status: Home / Self Care | Payer: Medicare HMO | Attending: Surgery | Admitting: Surgery

## 2018-02-18 ENCOUNTER — Other Ambulatory Visit: Payer: Self-pay

## 2018-02-18 ENCOUNTER — Encounter (HOSPITAL_COMMUNITY)
Admission: RE | Disposition: A | Discharge status: Home / Self Care | Payer: Self-pay | Source: Home / Self Care | Attending: Surgery

## 2018-02-18 DIAGNOSIS — E785 Hyperlipidemia, unspecified: Secondary | ICD-10-CM | POA: Diagnosis not present

## 2018-02-18 DIAGNOSIS — Z79899 Other long term (current) drug therapy: Secondary | ICD-10-CM | POA: Diagnosis not present

## 2018-02-18 DIAGNOSIS — Z79890 Hormone replacement therapy (postmenopausal): Secondary | ICD-10-CM | POA: Diagnosis not present

## 2018-02-18 DIAGNOSIS — K219 Gastro-esophageal reflux disease without esophagitis: Secondary | ICD-10-CM | POA: Diagnosis not present

## 2018-02-18 DIAGNOSIS — R69 Illness, unspecified: Secondary | ICD-10-CM | POA: Diagnosis not present

## 2018-02-18 DIAGNOSIS — F329 Major depressive disorder, single episode, unspecified: Secondary | ICD-10-CM | POA: Diagnosis not present

## 2018-02-18 DIAGNOSIS — K449 Diaphragmatic hernia without obstruction or gangrene: Secondary | ICD-10-CM | POA: Diagnosis not present

## 2018-02-18 DIAGNOSIS — E039 Hypothyroidism, unspecified: Secondary | ICD-10-CM | POA: Diagnosis not present

## 2018-02-18 DIAGNOSIS — Z8719 Personal history of other diseases of the digestive system: Secondary | ICD-10-CM

## 2018-02-18 DIAGNOSIS — Z9889 Other specified postprocedural states: Secondary | ICD-10-CM

## 2018-02-18 SURGERY — REPAIR, HERNIA, PARAESOPHAGEAL, LAPAROSCOPIC
Anesthesia: General | Site: Abdomen

## 2018-02-18 MED ORDER — LACTATED RINGERS IR SOLN
Status: DC | PRN
  Administered 2018-02-18: 1000 mL

## 2018-02-18 MED ORDER — LACTATED RINGERS IV SOLN
INTRAVENOUS | Status: DC
  Administered 2018-02-18: 07:00:00 via INTRAVENOUS

## 2018-02-18 MED ORDER — PROPOFOL 10 MG/ML IV BOLUS
INTRAVENOUS | Status: AC
  Filled 2018-02-18: qty 20

## 2018-02-18 MED ORDER — DEXAMETHASONE SODIUM PHOSPHATE 10 MG/ML IJ SOLN
INTRAMUSCULAR | Status: AC
  Filled 2018-02-18: qty 1

## 2018-02-18 MED ORDER — ROCURONIUM BROMIDE 10 MG/ML (PF) SYRINGE
PREFILLED_SYRINGE | INTRAVENOUS | Status: AC
  Filled 2018-02-18: qty 10

## 2018-02-18 MED ORDER — ONDANSETRON HCL 4 MG/2ML IJ SOLN: 4.0000 mg | Freq: Four times a day (QID) | INTRAMUSCULAR | Status: DC | PRN

## 2018-02-18 MED ORDER — DOCUSATE SODIUM 100 MG PO CAPS
100.0000 mg | ORAL_CAPSULE | Freq: Two times a day (BID) | ORAL | Status: DC
  Administered 2018-02-18 – 2018-02-19 (×2): 100 mg via ORAL
  Filled 2018-02-18 (×3): qty 1

## 2018-02-18 MED ORDER — LIDOCAINE 2% (20 MG/ML) 5 ML SYRINGE
INTRAMUSCULAR | Status: DC | PRN
  Administered 2018-02-18: 100 mg via INTRAVENOUS

## 2018-02-18 MED ORDER — GABAPENTIN 300 MG PO CAPS
300.0000 mg | ORAL_CAPSULE | Freq: Two times a day (BID) | ORAL | Status: DC
  Administered 2018-02-18 – 2018-02-19 (×3): 300 mg via ORAL
  Filled 2018-02-18 (×2): qty 1

## 2018-02-18 MED ORDER — BUPROPION HCL ER (XL) 300 MG PO TB24
300.0000 mg | ORAL_TABLET | Freq: Every day | ORAL | Status: DC
  Administered 2018-02-19: 300 mg via ORAL
  Filled 2018-02-18: qty 1

## 2018-02-18 MED ORDER — FENTANYL CITRATE (PF) 100 MCG/2ML IJ SOLN: 25.0000 ug | INTRAMUSCULAR | Status: DC | PRN

## 2018-02-18 MED ORDER — BUPIVACAINE-EPINEPHRINE (PF) 0.25% -1:200000 IJ SOLN
INTRAMUSCULAR | Status: DC | PRN
  Administered 2018-02-18: 30 mL

## 2018-02-18 MED ORDER — FENTANYL CITRATE (PF) 250 MCG/5ML IJ SOLN
INTRAMUSCULAR | Status: AC
  Filled 2018-02-18: qty 5

## 2018-02-18 MED ORDER — BUPIVACAINE-EPINEPHRINE (PF) 0.25% -1:200000 IJ SOLN
INTRAMUSCULAR | Status: AC
  Filled 2018-02-18: qty 30

## 2018-02-18 MED ORDER — GABAPENTIN 300 MG PO CAPS
300.0000 mg | ORAL_CAPSULE | ORAL | Status: AC
  Administered 2018-02-18: 300 mg via ORAL
  Filled 2018-02-18: qty 1

## 2018-02-18 MED ORDER — SUGAMMADEX SODIUM 200 MG/2ML IV SOLN
INTRAVENOUS | Status: AC
  Filled 2018-02-18: qty 2

## 2018-02-18 MED ORDER — 0.9 % SODIUM CHLORIDE (POUR BTL) OPTIME
TOPICAL | Status: DC | PRN
  Administered 2018-02-18: 1000 mL

## 2018-02-18 MED ORDER — METOCLOPRAMIDE HCL 5 MG/ML IJ SOLN
INTRAMUSCULAR | Status: AC
  Filled 2018-02-18: qty 2

## 2018-02-18 MED ORDER — METHOCARBAMOL 500 MG PO TABS: 500.0000 mg | ORAL_TABLET | Freq: Four times a day (QID) | ORAL | Status: DC | PRN

## 2018-02-18 MED ORDER — PANTOPRAZOLE SODIUM 40 MG IV SOLR
40.0000 mg | Freq: Every day | INTRAVENOUS | Status: DC
  Administered 2018-02-18: 40 mg via INTRAVENOUS
  Filled 2018-02-18: qty 40

## 2018-02-18 MED ORDER — CEFAZOLIN SODIUM-DEXTROSE 2-4 GM/100ML-% IV SOLN
2.0000 g | INTRAVENOUS | Status: AC
  Administered 2018-02-18: 2 g via INTRAVENOUS
  Filled 2018-02-18: qty 100

## 2018-02-18 MED ORDER — LIDOCAINE HCL 2 % IJ SOLN
INTRAMUSCULAR | Status: AC
  Filled 2018-02-18: qty 20

## 2018-02-18 MED ORDER — ACETAMINOPHEN 500 MG PO TABS
1000.0000 mg | ORAL_TABLET | Freq: Four times a day (QID) | ORAL | Status: DC
  Administered 2018-02-18 – 2018-02-19 (×4): 1000 mg via ORAL
  Filled 2018-02-18 (×4): qty 2

## 2018-02-18 MED ORDER — DIPHENHYDRAMINE HCL 12.5 MG/5ML PO ELIX
12.5000 mg | ORAL_SOLUTION | Freq: Four times a day (QID) | ORAL | Status: DC | PRN
  Filled 2018-02-18: qty 5

## 2018-02-18 MED ORDER — METOPROLOL TARTRATE 5 MG/5ML IV SOLN: 5.0000 mg | Freq: Four times a day (QID) | INTRAVENOUS | Status: DC | PRN

## 2018-02-18 MED ORDER — KETAMINE HCL 10 MG/ML IJ SOLN
INTRAMUSCULAR | Status: DC | PRN
  Administered 2018-02-18: 20 mg via INTRAVENOUS

## 2018-02-18 MED ORDER — ONDANSETRON HCL 4 MG/2ML IJ SOLN
INTRAMUSCULAR | Status: AC
  Filled 2018-02-18: qty 2

## 2018-02-18 MED ORDER — ONDANSETRON HCL 4 MG/2ML IJ SOLN
INTRAMUSCULAR | Status: DC | PRN
  Administered 2018-02-18: 4 mg via INTRAVENOUS

## 2018-02-18 MED ORDER — HYDRALAZINE HCL 20 MG/ML IJ SOLN: 10.0000 mg | INTRAMUSCULAR | Status: DC | PRN

## 2018-02-18 MED ORDER — HYDROMORPHONE HCL 1 MG/ML IJ SOLN: 0.5000 mg | Freq: Four times a day (QID) | INTRAMUSCULAR | Status: DC | PRN

## 2018-02-18 MED ORDER — ONDANSETRON 4 MG PO TBDP: 4.0000 mg | ORAL_TABLET | Freq: Four times a day (QID) | ORAL | Status: DC | PRN

## 2018-02-18 MED ORDER — ENOXAPARIN SODIUM 40 MG/0.4ML ~~LOC~~ SOLN: 40.0000 mg | SUBCUTANEOUS | Status: DC

## 2018-02-18 MED ORDER — SUGAMMADEX SODIUM 200 MG/2ML IV SOLN
INTRAVENOUS | Status: DC | PRN
  Administered 2018-02-18: 104 mg via INTRAVENOUS

## 2018-02-18 MED ORDER — PHENYLEPHRINE 40 MCG/ML (10ML) SYRINGE FOR IV PUSH (FOR BLOOD PRESSURE SUPPORT)
PREFILLED_SYRINGE | INTRAVENOUS | Status: AC
  Filled 2018-02-18: qty 10

## 2018-02-18 MED ORDER — ACETAMINOPHEN 500 MG PO TABS
1000.0000 mg | ORAL_TABLET | ORAL | Status: AC
  Administered 2018-02-18: 1000 mg via ORAL
  Filled 2018-02-18 (×2): qty 2

## 2018-02-18 MED ORDER — KETAMINE HCL 10 MG/ML IJ SOLN
INTRAMUSCULAR | Status: AC
  Filled 2018-02-18: qty 1

## 2018-02-18 MED ORDER — EPHEDRINE SULFATE-NACL 50-0.9 MG/10ML-% IV SOSY
PREFILLED_SYRINGE | INTRAVENOUS | Status: DC | PRN
  Administered 2018-02-18: 10 mg via INTRAVENOUS
  Administered 2018-02-18: 15 mg via INTRAVENOUS

## 2018-02-18 MED ORDER — LEVOTHYROXINE SODIUM 50 MCG PO TABS
50.0000 ug | ORAL_TABLET | Freq: Every day | ORAL | Status: DC
  Administered 2018-02-19: 50 ug via ORAL
  Filled 2018-02-18: qty 1

## 2018-02-18 MED ORDER — TRAMADOL HCL 50 MG PO TABS: 50.0000 mg | ORAL_TABLET | Freq: Four times a day (QID) | ORAL | Status: DC | PRN

## 2018-02-18 MED ORDER — METOCLOPRAMIDE HCL 5 MG/ML IJ SOLN
10.0000 mg | Freq: Four times a day (QID) | INTRAMUSCULAR | Status: DC
  Administered 2018-02-18 – 2018-02-19 (×5): 10 mg via INTRAVENOUS
  Filled 2018-02-18 (×4): qty 2

## 2018-02-18 MED ORDER — ROCURONIUM BROMIDE 10 MG/ML (PF) SYRINGE
PREFILLED_SYRINGE | INTRAVENOUS | Status: DC | PRN
  Administered 2018-02-18: 5 mg via INTRAVENOUS
  Administered 2018-02-18: 10 mg via INTRAVENOUS
  Administered 2018-02-18: 5 mg via INTRAVENOUS
  Administered 2018-02-18: 20 mg via INTRAVENOUS
  Administered 2018-02-18: 5 mg via INTRAVENOUS

## 2018-02-18 MED ORDER — DEXAMETHASONE SODIUM PHOSPHATE 10 MG/ML IJ SOLN
INTRAMUSCULAR | Status: DC | PRN
  Administered 2018-02-18: 5 mg via INTRAVENOUS

## 2018-02-18 MED ORDER — DIPHENHYDRAMINE HCL 50 MG/ML IJ SOLN: 12.5000 mg | Freq: Four times a day (QID) | INTRAMUSCULAR | Status: DC | PRN

## 2018-02-18 MED ORDER — FENTANYL CITRATE (PF) 250 MCG/5ML IJ SOLN
INTRAMUSCULAR | Status: DC | PRN
  Administered 2018-02-18: 100 ug via INTRAVENOUS

## 2018-02-18 MED ORDER — CHLORHEXIDINE GLUCONATE 4 % EX LIQD: 60.0000 mL | Freq: Once | CUTANEOUS | Status: DC

## 2018-02-18 MED ORDER — BUPIVACAINE LIPOSOME 1.3 % IJ SUSP
INTRAMUSCULAR | Status: DC | PRN
  Administered 2018-02-18: 20 mL

## 2018-02-18 MED ORDER — GABAPENTIN 300 MG PO CAPS
ORAL_CAPSULE | ORAL | Status: AC
  Filled 2018-02-18: qty 1

## 2018-02-18 MED ORDER — CITALOPRAM HYDROBROMIDE 10 MG PO TABS
10.0000 mg | ORAL_TABLET | Freq: Every day | ORAL | Status: DC
  Administered 2018-02-19: 10 mg via ORAL
  Filled 2018-02-18: qty 1

## 2018-02-18 MED ORDER — LIDOCAINE 2% (20 MG/ML) 5 ML SYRINGE
INTRAMUSCULAR | Status: AC
  Filled 2018-02-18: qty 5

## 2018-02-18 MED ORDER — SODIUM CHLORIDE 0.9 % IV SOLN
INTRAVENOUS | Status: DC
  Administered 2018-02-18: 18:00:00 via INTRAVENOUS

## 2018-02-18 MED ORDER — SUCCINYLCHOLINE CHLORIDE 200 MG/10ML IV SOSY
PREFILLED_SYRINGE | INTRAVENOUS | Status: DC | PRN
  Administered 2018-02-18: 120 mg via INTRAVENOUS

## 2018-02-18 MED ORDER — EPHEDRINE 5 MG/ML INJ
INTRAVENOUS | Status: AC
  Filled 2018-02-18: qty 10

## 2018-02-18 MED ORDER — PROPOFOL 10 MG/ML IV BOLUS
INTRAVENOUS | Status: DC | PRN
Start: 2018-02-18 — End: 2018-02-18
  Administered 2018-02-18: 130 mg via INTRAVENOUS

## 2018-02-18 MED ORDER — GLYCOPYRROLATE 0.2 MG/ML IJ SOLN
INTRAMUSCULAR | Status: DC | PRN
  Administered 2018-02-18: 0.2 mg via INTRAVENOUS

## 2018-02-18 MED ORDER — PHENYLEPHRINE 40 MCG/ML (10ML) SYRINGE FOR IV PUSH (FOR BLOOD PRESSURE SUPPORT)
PREFILLED_SYRINGE | INTRAVENOUS | Status: DC | PRN
  Administered 2018-02-18 (×6): 40 ug via INTRAVENOUS

## 2018-02-18 MED ORDER — LIDOCAINE 2% (20 MG/ML) 5 ML SYRINGE
INTRAMUSCULAR | Status: DC | PRN
  Administered 2018-02-18: 1.5 mg/kg/h via INTRAVENOUS

## 2018-02-18 MED ORDER — SUCCINYLCHOLINE CHLORIDE 200 MG/10ML IV SOSY
PREFILLED_SYRINGE | INTRAVENOUS | Status: AC
  Filled 2018-02-18: qty 10

## 2018-02-18 MED ORDER — KETOROLAC TROMETHAMINE 15 MG/ML IJ SOLN: 15.0000 mg | Freq: Four times a day (QID) | INTRAMUSCULAR | Status: DC | PRN

## 2018-02-18 SURGICAL SUPPLY — 46 items
ADH SKN CLS APL DERMABOND .7 (GAUZE/BANDAGES/DRESSINGS) ×1
APPLIER CLIP ROT 10 11.4 M/L (STAPLE)
APR CLP MED LRG 11.4X10 (STAPLE)
CABLE HIGH FREQUENCY MONO STRZ (ELECTRODE) ×2 IMPLANT
CLIP APPLIE ROT 10 11.4 M/L (STAPLE) IMPLANT
COVER SURGICAL LIGHT HANDLE (MISCELLANEOUS) ×2 IMPLANT
COVER WAND RF STERILE (DRAPES) ×1 IMPLANT
DECANTER SPIKE VIAL GLASS SM (MISCELLANEOUS) ×1 IMPLANT
DERMABOND ADVANCED (GAUZE/BANDAGES/DRESSINGS) ×1
DERMABOND ADVANCED .7 DNX12 (GAUZE/BANDAGES/DRESSINGS) ×1 IMPLANT
DEVICE SUT QUICK LOAD TK 5 (STAPLE) IMPLANT
DEVICE SUT TI-KNOT TK 5X26 (MISCELLANEOUS) IMPLANT
DEVICE SUTURE ENDOST 10MM (ENDOMECHANICALS) IMPLANT
DISSECTOR BLUNT TIP ENDO 5MM (MISCELLANEOUS) IMPLANT
DRAIN PENROSE 18X1/2 LTX STRL (DRAIN) ×2 IMPLANT
DRAPE LAPAROSCOPIC ABDOMINAL (DRAPES) IMPLANT
ELECT REM PT RETURN 15FT ADLT (MISCELLANEOUS) ×2 IMPLANT
FELT TEFLON 4 X1 (Mesh General) ×2 IMPLANT
GLOVE BIO SURGEON STRL SZ 6 (GLOVE) ×2 IMPLANT
GLOVE INDICATOR 6.5 STRL GRN (GLOVE) ×2 IMPLANT
GOWN STRL REUS W/TWL LRG LVL3 (GOWN DISPOSABLE) ×2 IMPLANT
GOWN STRL REUS W/TWL XL LVL3 (GOWN DISPOSABLE) ×4 IMPLANT
KIT BASIN OR (CUSTOM PROCEDURE TRAY) ×2 IMPLANT
LEGGING LITHOTOMY PAIR STRL (DRAPES) ×1 IMPLANT
MARKER SKIN DUAL TIP RULER LAB (MISCELLANEOUS) ×2 IMPLANT
NDL INSUFFLATION 14GA 120MM (NEEDLE) IMPLANT
NEEDLE INSUFFLATION 14GA 120MM (NEEDLE) IMPLANT
PROTECTOR NERVE ULNAR (MISCELLANEOUS) IMPLANT
SCISSORS LAP 5X35 DISP (ENDOMECHANICALS) ×2 IMPLANT
SET IRRIG TUBING LAPAROSCOPIC (IRRIGATION / IRRIGATOR) ×2 IMPLANT
SHEARS HARMONIC ACE PLUS 36CM (ENDOMECHANICALS) ×2 IMPLANT
SLEEVE XCEL OPT CAN 5 100 (ENDOMECHANICALS) ×5 IMPLANT
SUT ETHIBOND 0 36 GRN (SUTURE) ×10 IMPLANT
SUT MNCRL AB 4-0 PS2 18 (SUTURE) ×2 IMPLANT
TAPE CLOTH 4X10 WHT NS (GAUZE/BANDAGES/DRESSINGS) IMPLANT
TIP INNERVISION DETACH 40FR (MISCELLANEOUS) IMPLANT
TIP INNERVISION DETACH 50FR (MISCELLANEOUS) ×1 IMPLANT
TIP INNERVISION DETACH 56FR (MISCELLANEOUS) IMPLANT
TIPS INNERVISION DETACH 40FR (MISCELLANEOUS)
TOWEL OR 17X26 10 PK STRL BLUE (TOWEL DISPOSABLE) ×2 IMPLANT
TOWEL OR NON WOVEN STRL DISP B (DISPOSABLE) ×2 IMPLANT
TRAY FOLEY CATH 14FRSI W/METER (CATHETERS) ×1 IMPLANT
TRAY LAPAROSCOPIC (CUSTOM PROCEDURE TRAY) ×2 IMPLANT
TROCAR BLADELESS OPT 5 100 (ENDOMECHANICALS) ×2 IMPLANT
TROCAR XCEL NON-BLD 11X100MML (ENDOMECHANICALS) ×2 IMPLANT
TUBING INSUF HEATED (TUBING) ×2 IMPLANT

## 2018-02-18 NOTE — Transfer of Care (Signed)
Immediate Anesthesia Transfer of Care Note  Patient: DAHLIANA KARBER  Procedure(s) Performed: LAPAROSCOPIC PARAESOPHAGEAL HERNIA REPAIR AND NISSEN FUNDOPLICATION (N/A Abdomen)  Patient Location: PACU  Anesthesia Type:General  Level of Consciousness: awake, alert  and oriented  Airway & Oxygen Therapy: Patient Spontanous Breathing and Patient connected to face mask oxygen  Post-op Assessment: Report given to RN, Post -op Vital signs reviewed and stable and Patient moving all extremities  Post vital signs: Reviewed and stable  Last Vitals:  Vitals Value Taken Time  BP 159/89 02/18/2018 10:40 AM  Temp    Pulse 81 02/18/2018 10:43 AM  Resp 16 02/18/2018 10:43 AM  SpO2 100 % 02/18/2018 10:43 AM  Vitals shown include unvalidated device data.  Last Pain:  Vitals:   02/18/18 0629  TempSrc:   PainSc: 0-No pain         Complications: No apparent anesthesia complications

## 2018-02-18 NOTE — Interval H&P Note (Signed)
History and Physical Interval Note:  02/18/2018 7:09 AM  Megan Craig  has presented today for surgery, with the diagnosis of hiatal hernia  The various methods of treatment have been discussed with the patient and family. After consideration of risks, benefits and other options for treatment, the patient has consented to  Procedure(s): LAPAROSCOPIC PARAESOPHAGEAL HERNIA REPAIR AND NISSEN FUNDOPLICATION (N/A) as a surgical intervention .  The patient's history has been reviewed, patient examined, no change in status, stable for surgery.  I have reviewed the patient's chart and labs.  Questions were answered to the patient's satisfaction.     Cierra Rothgeb Lollie Sails

## 2018-02-18 NOTE — Anesthesia Preprocedure Evaluation (Addendum)
Anesthesia Evaluation  Patient identified by MRN, date of birth, ID band Patient awake    Reviewed: Allergy & Precautions, Patient's Chart, lab work & pertinent test results  Airway Mallampati: II  TM Distance: >3 FB     Dental   Pulmonary neg pulmonary ROS,    breath sounds clear to auscultation       Cardiovascular negative cardio ROS   Rhythm:Regular Rate:Normal     Neuro/Psych    GI/Hepatic Neg liver ROS, hiatal hernia,   Endo/Other  Hypothyroidism   Renal/GU negative Renal ROS     Musculoskeletal  (+) Arthritis ,   Abdominal   Peds  Hematology   Anesthesia Other Findings   Reproductive/Obstetrics                             Anesthesia Physical Anesthesia Plan  ASA: III  Anesthesia Plan: General   Post-op Pain Management:    Induction: Intravenous  PONV Risk Score and Plan: 3 and Dexamethasone, Ondansetron, Midazolam and Treatment may vary due to age or medical condition  Airway Management Planned: Oral ETT  Additional Equipment:   Intra-op Plan:   Post-operative Plan: Possible Post-op intubation/ventilation  Informed Consent: I have reviewed the patients History and Physical, chart, labs and discussed the procedure including the risks, benefits and alternatives for the proposed anesthesia with the patient or authorized representative who has indicated his/her understanding and acceptance.   Dental advisory given  Plan Discussed with: CRNA and Anesthesiologist  Anesthesia Plan Comments:         Anesthesia Quick Evaluation

## 2018-02-18 NOTE — Anesthesia Procedure Notes (Signed)
Procedure Name: Intubation Date/Time: 02/18/2018 7:37 AM Performed by: Eben Burow, CRNA Pre-anesthesia Checklist: Patient identified, Emergency Drugs available, Suction available, Patient being monitored and Timeout performed Patient Re-evaluated:Patient Re-evaluated prior to induction Oxygen Delivery Method: Circle system utilized Preoxygenation: Pre-oxygenation with 100% oxygen Induction Type: IV induction, Rapid sequence and Cricoid Pressure applied Laryngoscope Size: Mac and 3 Grade View: Grade II Tube type: Oral Tube size: 7.0 mm Number of attempts: 1 Airway Equipment and Method: Stylet Placement Confirmation: ETT inserted through vocal cords under direct vision,  positive ETCO2 and breath sounds checked- equal and bilateral Secured at: 21 cm Tube secured with: Tape Dental Injury: Teeth and Oropharynx as per pre-operative assessment

## 2018-02-18 NOTE — Op Note (Signed)
Operative Note  VELINA RHYNE  [id number]  [id number]  02/18/2018   Surgeon: Lady Deutscher ConnorMD  Assistant: Jaclynn Guarneri MD  Procedure performed: Laparoscopic paraesophageal hernia repair, Nissen Fundoplication  Preop diagnosis: paraesophageal hernia Post-op diagnosis/intraop findings: same  Specimens: none Retained items: none EBL: minimal cc Complications: none  Description of procedure: After obtaining informed consent the patient was taken to the operating room and placed supine on operating room table wheregeneral endotracheal anesthesia was initiated, preoperative antibiotics were administered, SCDs applied, and a formal timeout was performed. The abdomen was prepped and draped in the usual sterile fashion. The peritoneal cavity was entered with a visiport in the left upper quadrant and insufflated to . Michaell Cowing inspection revealed no evidence of injury from entry 4 other intra-abdominal abnormalities. Bilateral TAPS blocks were performed under laparoscopic visualization using exparel mixed with quarter percent bupivacaine. Under direct visualization, bilateral 5 mm trochars, a right paramedian 11 mm trocar, and a supraumbilical 5 mm trocar were placed. A subxiphoid incision was made and the liver retractor introduced for fixed retraction of the left lobe. The patient was then placed in steep reverse Trendelenburg and we began our dissection by  Dividing the pars lucida and taking the sac down from the hiatus anteriorly and exposing the right crus. We then turned to the left side and began taking down the short gastrics using the Harmonic scalpel. This was carried all the way to the left crus which was then cleared off. I proceeded to divide the sac from the crura proceeding anteriorly using the harmonic scalpel and blunt dissection. This continued until we connected to the other side dissection and the sac was reduced into the abdomen where the stomach now rested without  tension. I completed the circumferential dissection of the distal esophagus by dividing some strands of the sac posteriorly taking care to preserve the esophagus and vagus. Once circumferential dissection was complete a Penrose was placed around the GE junction and used for retraction. The mediastinal attachments of the esophagus were dissected bluntly an with Harmonic scalpel in order to improve our intra-abdominal esophageal length. Once we had reached 3-4 cm of tension-free intra-abdominal esophageal length, I completed that part of the dissection. At this point the crura were reapproximated with interrupted 0 Ethibonds with pledgets. A total of 4 sutures were used, the most superior of which did not have a pledget. This narrowed the hiatus nicely to about 2-1/2 cm in diameter. The excess hernia sac was excised and discarded. A 50 French lighted bougie was then introduced under direct visualization and the fundus of the stomach was grasped from the right side and pulled to wrap around the distal esophagus. The wrap was completed with 3 interrupted sutures of 0 Ethibond, the superior suture grabbing a bite of esophageal wall. The final wrap was about 1.5 cm in length and easily accommodated a grasper even with the bougie in place. The bougie was removed and our wrap inspected it sat nicely within the abdomen without any tension. The suture line sat at about 11:00. The abdomen was then inspected for hemostasis which was found to be adequate. No evidence of other injuries. The liver retractor was removed under direct visualization. The abdomen was then desufflated and all trochars removed. Our skin incisions were closed with running subcuticular Monocryl after infiltrating with the remaining local. Dermabond was then applied. The patient was then awakened, extubated and taken to PACU in stable condition.   All counts were correct at the completion of  the case.

## 2018-02-18 NOTE — Anesthesia Postprocedure Evaluation (Signed)
dd Anesthesia Post Note  Patient: Megan Craig  Procedure(s) Performed: LAPAROSCOPIC PARAESOPHAGEAL HERNIA REPAIR AND NISSEN FUNDOPLICATION (N/A Abdomen)     Patient location during evaluation: PACU Anesthesia Type: General Level of consciousness: awake Pain management: pain level controlled Vital Signs Assessment: post-procedure vital signs reviewed and stable Respiratory status: spontaneous breathing Cardiovascular status: stable Postop Assessment: no apparent nausea or vomiting Anesthetic complications: no    Last Vitals:  Vitals:   02/18/18 1130 02/18/18 1230  BP: (!) 155/78 138/85  Pulse: 78 70  Resp: 17 16  Temp: 36.4 C (!) 36.4 C  SpO2: 97% 96%    Last Pain:  Vitals:   02/18/18 1130  TempSrc:   PainSc: 0-No pain                 Billy Turvey

## 2018-02-19 ENCOUNTER — Observation Stay (HOSPITAL_COMMUNITY): Payer: Medicare HMO

## 2018-02-19 DIAGNOSIS — K449 Diaphragmatic hernia without obstruction or gangrene: Secondary | ICD-10-CM | POA: Diagnosis not present

## 2018-02-19 DIAGNOSIS — K222 Esophageal obstruction: Secondary | ICD-10-CM | POA: Diagnosis not present

## 2018-02-19 LAB — BASIC METABOLIC PANEL
Anion gap: 7 (ref 5–15)
BUN: 12 mg/dL (ref 8–23)
CO2: 27 mmol/L (ref 22–32)
Calcium: 9.2 mg/dL (ref 8.9–10.3)
Chloride: 105 mmol/L (ref 98–111)
Creatinine, Ser: 0.73 mg/dL (ref 0.44–1.00)
GFR calc Af Amer: >60 mL/min (ref >60)
GFR calc non Af Amer: >60 mL/min (ref >60)
Glucose, Bld: 117 mg/dL — ABNORMAL HIGH (ref 70–99)
Potassium: 4 mmol/L (ref 3.5–5.1)
Sodium: 139 mmol/L (ref 135–145)

## 2018-02-19 LAB — CBC
HEMATOCRIT: 33.7 % — AB (ref 36.0–46.0)
Hemoglobin: 10.7 g/dL — ABNORMAL LOW (ref 12.0–15.0)
MCH: 30.5 pg (ref 26.0–34.0)
MCHC: 31.8 g/dL (ref 30.0–36.0)
MCV: 96 fL (ref 80.0–100.0)
Platelets: 217 10*3/uL (ref 150–400)
RBC: 3.51 MIL/uL — ABNORMAL LOW (ref 3.87–5.11)
RDW: 14 % (ref 11.5–15.5)
WBC: 9.4 10*3/uL (ref 4.0–10.5)
nRBC: 0 % (ref 0.0–0.2)

## 2018-02-19 LAB — MAGNESIUM: Magnesium: 2.1 mg/dL (ref 1.7–2.4)

## 2018-02-19 MED ORDER — TRAMADOL HCL 50 MG PO TABS: 50.0000 mg | ORAL_TABLET | Freq: Four times a day (QID) | ORAL | 0 refills | Status: DC | PRN

## 2018-02-19 MED ORDER — DOCUSATE SODIUM 100 MG PO CAPS: 100.0000 mg | ORAL_CAPSULE | Freq: Two times a day (BID) | ORAL | 0 refills | Status: DC

## 2018-02-19 MED ORDER — ONDANSETRON 4 MG PO TBDP: 4.0000 mg | ORAL_TABLET | Freq: Four times a day (QID) | ORAL | 0 refills | Status: DC | PRN

## 2018-02-19 MED ORDER — IOPAMIDOL (ISOVUE-300) INJECTION 61%
INTRAVENOUS | Status: AC
  Administered 2018-02-19: 50 mL via ORAL
  Filled 2018-02-19: qty 100

## 2018-02-19 MED ORDER — ACETAMINOPHEN 325 MG PO TABS: 650.0000 mg | ORAL_TABLET | Freq: Four times a day (QID) | ORAL | Status: DC | PRN

## 2018-02-19 MED ORDER — IOPAMIDOL (ISOVUE-300) INJECTION 61%
50.0000 mL | Freq: Once | INTRAVENOUS | Status: AC | PRN
  Administered 2018-02-19: 50 mL via ORAL

## 2018-02-19 NOTE — Discharge Instructions (Signed)
POST-OP INSTRUCTIONS: 1. Take your usual medications 2. Take tylenol and ibuprofen (or naproxen) for the first few days. If pain is not relieved, use the prescribed pain medication if needed.  3. Avoid any vomiting, wretching, or strenuous coughing for the first 6 weeks to minimize stress on the repair. Use the prescribed nausea medication if needed.  4. No heavy lifting (more than 15lb) or strenuous activity for the first 6 weeks for the same reason as above 5. Walking and light activity daily is encouraged. 6. OK to shower and pat dry. The skin glue is water proof. This will flake off after about 2 weeks.  7. Follow the diet progression described below.  8. Avoid getting constipated/ straining. Take an over-the-counter stool softener regularly for the first several days after surgery. Ok to stop or decrease this if having loose stools.   EATING AFTER YOUR PARAESOPHAGEAL HERNIA REPAIR & NISSEN FUNDOPLICATION  ######################################################################  EAT Start with a pureed / full liquid diet (see below) Gradually transition to a high fiber diet with a fiber supplement over the next month after discharge.    WALK Walk an hour a day.  Control your pain to do that.    CONTROL PAIN Control pain so that you can walk, sleep, tolerate sneezing/coughing, go up/down stairs.  HAVE A BOWEL MOVEMENT DAILY Keep your bowels regular to avoid problems.  OK to try a laxative to override constipation.  OK to use an antidairrheal to slow down diarrhea.  Call if not better after 2 tries  CALL IF YOU HAVE PROBLEMS/CONCERNS Call if you are still struggling despite following these instructions. Call if you have concerns not answered by these instructions  ######################################################################   After your esophageal surgery, expect some sticking with swallowing over the next 1-2 months.    If food sticks when you eat, it is called  "dysphagia".  This is due to swelling around your esophagus at the wrap & hiatal diaphragm repair.  It will gradually ease off over the next few months.  To help you through this temporary phase, we start you out on a pureed (blenderized) diet.  Your first meal in the hospital was thin liquids.  You should have been given a pureed diet by the time you left the hospital.  We ask patients to stay on a pureed diet for the first 2-3 weeks to avoid anything getting "stuck" near your recent surgery.  Don't be alarmed if your ability to swallow doesn't progress according to this plan.  Everyone is different and some diets can advance more or less quickly.     Some BASIC RULES to follow are:  Maintain an upright position whenever eating or drinking.  Take small bites - just a teaspoon size bite at a time.  Eat slowly.  It may also help to eat only one food at a time.  Consider nibbling through smaller, more frequent meals & avoid the urge to eat BIG meals  Do not push through feelings of fullness, nausea, or bloatedness  Do not mix solid foods and liquids in the same mouthful  Try not to "wash foods down" with large gulps of liquids.  Avoid carbonated (bubbly/fizzy) drinks.    Avoid foods that make you feel gassy or bloated.  Start with bland foods first.  Wait on trying greasy, fried, or spicy meals until you are tolerating more bland solids well.  Understand that it will be hard to burp and belch at first.  This gradually improves with time.  Expect to be more gassy/flatulent/bloated initially.  Walking will help your body manage it better.  Consider using medications for bloating that contain simethicone such as  Maalox or Gas-X   Eat in a relaxed atmosphere & minimize distractions.  Avoid talking while eating.    Do not use straws.  Following each meal, sit in an upright position (90 degree angle) for 60 to 90 minutes.  Going for a short walk can help as well  If food does stick,  don't panic.  Try to relax and let the food pass on its own.  Sipping WARM LIQUID such as strong hot black tea can also help slide it down.   Be gradual in changes & use common sense:  -If you easily tolerating a certain "level" of foods, advance to the next level gradually -If you are having trouble swallowing a particular food, then avoid it.   -If food is sticking when you advance your diet, go back to thinner previous diet (the lower LEVEL) for 1-2 days.  LEVEL 1 = PUREED DIET  Do for the first 2 WEEKS AFTER SURGERY  -Foods in this group are pureed or blenderized to a smooth, mashed potato-like consistency.  -If necessary, the pureed foods can keep their shape with the addition of a thickening agent.   -Meat should be pureed to a smooth, pasty consistency.  Hot broth or gravy may be added to the pureed meat, approximately 1 oz. of liquid per 3 oz. serving of meat. -CAUTION:  If any foods do not puree into a smooth consistency, swallowing will be more difficult.  (For example, nuts or seeds sometimes do not blend well.)  Hot Foods Cold Foods  Pureed scrambled eggs and cheese Pureed cottage cheese  Baby cereals Thickened juices and nectars  Thinned cooked cereals (no lumps) Thickened milk or eggnog  Pureed [place] toast or pancakes Ensure  Mashed potatoes Ice cream  Pureed parsley, au gratin, scalloped potatoes, candied sweet potatoes Fruit or [place] ice, sherbet  Pureed buttered or alfredo noodles Plain yogurt  Pureed vegetables (no corn or peas) Instant breakfast  Pureed soups and creamed soups Smooth pudding, mousse, custard  Pureed scalloped apples Whipped gelatin  Gravies Sugar, syrup, honey, jelly  Sauces, cheese, tomato, barbecue, white, creamed Cream  Any baby food Creamer  Alcohol in moderation (not beer or champagne) Margarine  Coffee or tea Mayonnaise   Ketchup, mustard   Apple sauce   SAMPLE MENU:  PUREED DIET Breakfast Lunch Dinner   Orange juice, 1/2  cup  Cream of wheat, 1/2 cup  Pineapple juice, 1/2 cup  Pureed [place]urkey, barley soup, 3/4 cup  Pureed Hawaiian chicken, 3 oz   Scrambled eggs, mashed or blended with cheese, 1/2 cup  Tea or coffee, 1 cup   Whole milk, 1 cup   Non-dairy creamer, 2 Tbsp.  Mashed potatoes, 1/2 cup  Pureed cooled broccoli, 1/2 cup  Apple sauce, 1/2 cup  Coffee or tea  Mashed potatoes, 1/2 cup  Pureed spinach, 1/2 cup  Frozen yogurt, 1/2 cup  Tea or coffee      LEVEL 2 = SOFT DIET  After your first 2 weeks, you can advance to a soft diet.   Keep on this diet until everything goes down easily.  Hot Foods Cold Foods  White fish Cottage cheese  Stuffed fish Junior baby fruit  Baby food meals Semi thickened juices  Minced soft cooked, scrambled, poached eggs nectars  Souffle & omelets Ripe mashed bananas  Cooked  cereals Canned fruit, pineapple sauce, milk  potatoes Milkshake  Buttered or Alfredo noodles Custard  Cooked cooled vegetable Puddings, including tapioca  Sherbet Yogurt  Vegetable soup or alphabet soup Fruit ice, [place] ice  Gravies Whipped gelatin  Sugar, syrup, honey, jelly Junior baby desserts  Sauces:  Cheese, creamed, barbecue, tomato, white Cream  Coffee or tea Margarine   SAMPLE MENU:  LEVEL 2 Breakfast Lunch Dinner   Orange juice, 1/2 cup  Oatmeal, 1/2 cup  Scrambled eggs with cheese, 1/2 cup  Decaffeinated tea, 1 cup  Whole milk, 1 cup  Non-dairy creamer, 2 Tbsp  Pineapple juice, 1/2 cup  Minced beef, 3 oz  Gravy, 2 Tbsp  Mashed potatoes, 1/2 cup  Minced fresh broccoli, 1/2 cup  Applesauce, 1/2 cup  Coffee, 1 cup  [place], barley soup, 3/4 cup  Minced Hawaiian chicken, 3 oz  Mashed potatoes, 1/2 cup  Cooked spinach, 1/2 cup  Frozen yogurt, 1/2 cup  Non-dairy creamer, 2 Tbsp      LEVEL 3 = CHOPPED DIET  -After all the foods in level 2 (soft diet) are passing through well you should advance up to more chopped foods.  -It is still  important to cut these foods into small pieces and eat slowly.  Hot Foods Cold Foods  Poultry Cottage cheese  Chopped Swedish meatballs Yogurt  Meat salads (ground or flaked meat) Milk  Flaked fish (tuna) Milkshakes  Poached or scrambled eggs Soft, cold, dry cereal  Souffles and omelets Fruit juices or nectars  Cooked cereals Chopped canned fruit  Chopped [place] toast or pancakes Canned fruit cocktail  Noodles or pasta (no rice) Pudding, mousse, custard  Cooked vegetables (no frozen peas, corn, or mixed vegetables) Green salad  Canned small sweet peas Ice cream  Creamed soup or vegetable soup Fruit ice, [place] ice  Pureed vegetable soup or alphabet soup Non-dairy creamer  Ground scalloped apples Margarine  Gravies Mayonnaise  Sauces:  Cheese, creamed, barbecue, tomato, white Ketchup  Coffee or tea Mustard   SAMPLE MENU:  LEVEL 3 Breakfast Lunch Dinner   Orange juice, 1/2 cup  Oatmeal, 1/2 cup  Scrambled eggs with cheese, 1/2 cup  Decaffeinated tea, 1 cup  Whole milk, 1 cup  Non-dairy creamer, 2 Tbsp  Ketchup, 1 Tbsp  Margarine, 1 tsp  Salt, 1/4 tsp  Sugar, 2 tsp  Pineapple juice, 1/2 cup  Ground beef, 3 oz  Gravy, 2 Tbsp  Mashed potatoes, 1/2 cup  Cooked spinach, 1/2 cup  Applesauce, 1/2 cup  Decaffeinated coffee  Whole milk  Non-dairy creamer, 2 Tbsp  Margarine, 1 tsp  Salt, 1/4 tsp  Pureed [place], barley soup, 3/4 cup  Barbecue chicken, 3 oz  Mashed potatoes, 1/2 cup  Ground fresh broccoli, 1/2 cup  Frozen yogurt, 1/2 cup  Decaffeinated tea, 1 cup  Non-dairy creamer, 2 Tbsp  Margarine, 1 tsp  Salt, 1/4 tsp  Sugar, 1 tsp    LEVEL 4:  REGULAR FOODS  -Foods in this group are soft, moist, regularly textured foods.   -This level includes meat and breads, which tend to be the hardest things to swallow.   -Eat very slowly, chew well and continue to avoid carbonated drinks. -most people are at this level in 4-6 weeks  Hot Foods  Cold Foods  Baked fish or skinned Soft cheeses - cottage cheese  Souffles and omelets Cream cheese  Eggs Yogurt  Stuffed shells Milk  Spaghetti with meat sauce Milkshakes  Cooked cereal Cold dry cereals (no nuts,  dried fruit, coconut)  [place] toast or pancakes Crackers  Buttered toast Fruit juices or nectars  Noodles or pasta (no rice) Canned fruit  Potatoes (all types) Ripe bananas  Soft, cooked vegetables (no corn, lima, or baked beans) Peeled, ripe, fresh fruit  Creamed soups or vegetable soup Cakes (no nuts, dried fruit, coconut)  Canned chicken noodle soup Plain doughnuts  Gravies Ice cream  Bacon dressing Pudding, mousse, custard  Sauces:  Cheese, creamed, barbecue, tomato, white Fruit ice, [place] ice, sherbet  Decaffeinated tea or coffee Whipped gelatin  Pork chops Regular gelatin   Canned fruited gelatin molds   Sugar, syrup, honey, jam, jelly   Cream   Non-dairy   Margarine   Oil   Mayonnaise   Ketchup   Mustard   TROUBLESHOOTING IRREGULAR BOWELS  1) Avoid extremes of bowel movements (no bad constipation/diarrhea)  2) Miralax 17gm mixed in 8oz. water or juice-daily. May use BID as needed.  3) Gas-x,Phazyme, etc. as needed for gas & bloating.  4) Soft,bland diet. No spicy,greasy,fried foods.  5) Prilosec over-the-counter as needed  6) May hold gluten/wheat products from diet to see if symptoms improve.  7) May try probiotics (Align, Activa, etc) to help calm the bowels down  7) If symptoms become worse call back immediately.    If you have any questions please call our office at [facility]: [phone].

## 2018-02-19 NOTE — Progress Notes (Addendum)
S: Had a good night. Denies any pain. Denies nausea, dysphagia, or reflux. Tolerating liquids well. Walking around the room, using IS.   Vitals, labs, intake/output, and orders reviewed at this time. Tmax 99.6. HR 70-84. Normotensive. Sat 92% room air.  PO 600, UOP 1900 BMP and Mag normal. WBC 9.4 (6.1), hgb 10.7 (12.7), plt 217 (271) Meds- receiving scheduled reglan, tylenol and gabapentin. Has received NONE of the availabe PRNs for pain or nausea.   Gen: A&Ox3, no distress  H&N: EOMI, atraumatic, neck supple Chest: unlabored respirations, RRR Abd: soft, nontender, nondistended, incisions c/d/i with dermabond. No cellulitis, there is evolving ecchymosis at each and tiny hematoma at the left paramedian incision Ext: warm, no edema Neuro: grossly normal  Lines/tubes/drains: PIV  A/P:  72yo woman POD 1 s/p laparoscopic repair of large paraesophageal hernia, nissen fundoplication, doing well -UGI today -Advance to puree diet, stop IVF -Ambulate, IS -Acute blood loss anemia- no clinical evidence of bleeding, suspect this is partially dilutional -SCDs, hold lovenox given drop in hgb -Plan discharge later today    Phylliss Blakes, MD [facility], [place] Pager [phone]

## 2018-02-19 NOTE — Progress Notes (Signed)
Patient assisted by tech and wheelchair to transportation with family member. Discharge education provided; all questions addressed and answered. All belongings taken with patient. Vital signs stable, pain controlled.

## 2018-02-19 NOTE — Discharge Summary (Signed)
Physician Discharge Summary  Patient ID: Megan Craig MRN: [id number] DOB/AGE: 1945-12-17 73 y.o.  Admit date: 02/18/2018 Discharge date: 02/19/2018  Admission Diagnoses: Paraesophageal hernia  Discharge Diagnoses:  Active Problems:   S/P repair of paraesophageal hernia   Discharged Condition: good  Hospital Course: Admitted for observation following laparoscopic repair of large paraesophageal hernia and nissen fundoplication. Her pain was well controlled and she was able to tolerate a liquid and then puree diet. She was able to ambulate unassisted. UGI appropriate.    Significant Diagnostic Studies: UGi  Treatments: surgery as above  Discharge Exam: Blood pressure 120/78, pulse 73, temperature 98.8 F (37.1 C), temperature source Oral, resp. rate 18, height 5' (1.524 m), weight 52 kg, SpO2 93 %. see rounding note  Disposition: Discharge disposition: 01-Home or Self Care       Discharge Instructions    Discharge patient   Complete by:  As directed    After upper GI is complete and patient tolerates puree diet for lunch   Discharge disposition:  01-Home or Self Care   Discharge patient date:  02/19/2018     Allergies as of 02/19/2018   No Known Allergies     Medication List    TAKE these medications   acetaminophen 325 MG tablet Commonly known as:  TYLENOL Take 2 tablets (650 mg total) by mouth every 6 (six) hours as needed for mild pain or moderate pain.   aspirin EC 81 MG tablet Take 81 mg by mouth daily.   buPROPion 300 MG 24 hr tablet Commonly known as:  WELLBUTRIN XL Take 300 mg by mouth daily.   Calcium Carbonate-Vitamin D 600-400 MG-UNIT tablet Take 1 tablet by mouth 2 (two) times daily.   citalopram 10 MG tablet Commonly known as:  CELEXA Take 10 mg by mouth daily.   docusate sodium 100 MG capsule Commonly known as:  COLACE Take 1 capsule (100 mg total) by mouth 2 (two) times daily.   EQL OMEGA 3 FISH OIL 1400 MG Caps Take 1,400 mg by  mouth daily.   levothyroxine 50 MCG tablet Commonly known as:  SYNTHROID, LEVOTHROID Take 50 mcg by mouth daily before breakfast.   multivitamin with minerals Tabs tablet Take 1 tablet by mouth daily. Alive Multivitamin   naproxen sodium 220 MG tablet Commonly known as:  ALEVE Take 440 mg by mouth every 6 (six) hours as needed (for pain.).   ondansetron 4 MG disintegrating tablet Commonly known as:  ZOFRAN-ODT Take 1 tablet (4 mg total) by mouth every 6 (six) hours as needed for nausea.   simvastatin 20 MG tablet Commonly known as:  ZOCOR Take 20 mg by mouth daily.   traMADol 50 MG tablet Commonly known as:  ULTRAM Take 1 tablet (50 mg total) by mouth every 6 (six) hours as needed (mild pain).      Follow-up Information    Berna Bue, MD Follow up in 3 week(s).   Specialty:  General Surgery Contact information: [address] [place] [address] [phone]           Signed: Berna Bue 02/19/2018, 7:37 AM

## 2018-02-19 NOTE — Plan of Care (Signed)

## 2018-08-12 DIAGNOSIS — Z0000 Encounter for general adult medical examination without abnormal findings: Secondary | ICD-10-CM | POA: Diagnosis not present

## 2018-08-12 DIAGNOSIS — E7800 Pure hypercholesterolemia, unspecified: Secondary | ICD-10-CM | POA: Diagnosis not present

## 2018-08-12 DIAGNOSIS — R69 Illness, unspecified: Secondary | ICD-10-CM | POA: Diagnosis not present

## 2018-08-12 DIAGNOSIS — E039 Hypothyroidism, unspecified: Secondary | ICD-10-CM | POA: Diagnosis not present

## 2018-08-12 DIAGNOSIS — M8580 Other specified disorders of bone density and structure, unspecified site: Secondary | ICD-10-CM | POA: Diagnosis not present

## 2018-08-13 DIAGNOSIS — E7800 Pure hypercholesterolemia, unspecified: Secondary | ICD-10-CM | POA: Diagnosis not present

## 2018-08-13 DIAGNOSIS — E039 Hypothyroidism, unspecified: Secondary | ICD-10-CM | POA: Diagnosis not present

## 2018-10-10 DIAGNOSIS — Z1231 Encounter for screening mammogram for malignant neoplasm of breast: Secondary | ICD-10-CM | POA: Diagnosis not present

## 2018-11-05 DIAGNOSIS — R69 Illness, unspecified: Secondary | ICD-10-CM | POA: Diagnosis not present

## 2019-02-12 DIAGNOSIS — R69 Illness, unspecified: Secondary | ICD-10-CM | POA: Diagnosis not present

## 2019-02-12 DIAGNOSIS — E039 Hypothyroidism, unspecified: Secondary | ICD-10-CM | POA: Diagnosis not present

## 2019-02-12 DIAGNOSIS — E7800 Pure hypercholesterolemia, unspecified: Secondary | ICD-10-CM | POA: Diagnosis not present

## 2019-02-13 DIAGNOSIS — E039 Hypothyroidism, unspecified: Secondary | ICD-10-CM | POA: Diagnosis not present

## 2019-02-13 DIAGNOSIS — E7800 Pure hypercholesterolemia, unspecified: Secondary | ICD-10-CM | POA: Diagnosis not present

## 2019-05-28 DIAGNOSIS — K219 Gastro-esophageal reflux disease without esophagitis: Secondary | ICD-10-CM | POA: Diagnosis not present

## 2019-07-02 DIAGNOSIS — H52223 Regular astigmatism, bilateral: Secondary | ICD-10-CM | POA: Diagnosis not present

## 2019-07-02 DIAGNOSIS — Z9849 Cataract extraction status, unspecified eye: Secondary | ICD-10-CM | POA: Diagnosis not present

## 2019-07-02 DIAGNOSIS — H5202 Hypermetropia, left eye: Secondary | ICD-10-CM | POA: Diagnosis not present

## 2019-07-02 DIAGNOSIS — Z961 Presence of intraocular lens: Secondary | ICD-10-CM | POA: Diagnosis not present

## 2019-07-02 DIAGNOSIS — H524 Presbyopia: Secondary | ICD-10-CM | POA: Diagnosis not present

## 2019-07-09 DIAGNOSIS — L509 Urticaria, unspecified: Secondary | ICD-10-CM | POA: Diagnosis not present

## 2019-08-13 DIAGNOSIS — Z0000 Encounter for general adult medical examination without abnormal findings: Secondary | ICD-10-CM | POA: Diagnosis not present

## 2019-08-13 DIAGNOSIS — M8580 Other specified disorders of bone density and structure, unspecified site: Secondary | ICD-10-CM | POA: Diagnosis not present

## 2019-08-13 DIAGNOSIS — R69 Illness, unspecified: Secondary | ICD-10-CM | POA: Diagnosis not present

## 2019-08-13 DIAGNOSIS — E7800 Pure hypercholesterolemia, unspecified: Secondary | ICD-10-CM | POA: Diagnosis not present

## 2019-08-13 DIAGNOSIS — E039 Hypothyroidism, unspecified: Secondary | ICD-10-CM | POA: Diagnosis not present

## 2019-09-15 DIAGNOSIS — Z20822 Contact with and (suspected) exposure to covid-19: Secondary | ICD-10-CM | POA: Diagnosis not present

## 2019-10-09 ENCOUNTER — Ambulatory Visit: Payer: Self-pay

## 2019-10-09 ENCOUNTER — Ambulatory Visit: Payer: Medicare HMO | Admitting: Podiatry

## 2019-10-09 ENCOUNTER — Other Ambulatory Visit: Payer: Self-pay

## 2019-10-09 ENCOUNTER — Ambulatory Visit (INDEPENDENT_AMBULATORY_CARE_PROVIDER_SITE_OTHER): Payer: Medicare HMO

## 2019-10-09 DIAGNOSIS — M2042 Other hammer toe(s) (acquired), left foot: Secondary | ICD-10-CM

## 2019-10-09 NOTE — Progress Notes (Signed)
She presents today after having not seen her for couple of years with chief complaint of a painful second toe of the left foot.  Last time we saw each other we discussed removing the second toe illness which she would like to have done.  She states is becoming more more painful after her previous toe surgeries and bunion repairs and she states that she would rather have the toe off and it continued to hurt.  Objective: Vital signs are stable she is alert oriented x3 pulses are palpable.  She has an overlapping second digit of the hallux left which has been surgically corrected by another physician at a previous time.  At this point the toe overlaps and is causing pain not only to the hallux but the second toe as well.  Radiographs taken today of the bilateral foot demonstrates an osseously mature individual with overlapping second toe and a complete dislocation of the second metatarsophalangeal joint of the left foot.  Assessment: Painful second toe left foot.  Plan: Discussed etiology pathology conservative versus surgical therapies.  At this point we consented her for amputation of the second toe with disarticulation at the metatarsophalangeal joint second left.  We discussed the pros and cons of the surgery she understands the risks involved and the fact that there are complications associated with this which may include but not limited to postop pain bleeding swelling infection recurrence need for further surgery chronic pain etc.  We also dispensed a Cam walker today as well as information regarding the surgery center and anesthesia group.  I will follow-up with her in the near future for surgical intervention.

## 2019-10-14 DIAGNOSIS — Z1231 Encounter for screening mammogram for malignant neoplasm of breast: Secondary | ICD-10-CM | POA: Diagnosis not present

## 2019-11-05 ENCOUNTER — Telehealth: Payer: Self-pay

## 2019-11-05 NOTE — Telephone Encounter (Signed)
DOS 11/21/2019  AMPUTATION TOE MPJ JOINT 2ND LT - [id number]  [employer] MEDICARE EFFECTIVE DATE - 02/06/2018  PLAN DEDUCTIBLE - $0.00 OUT OF POCKET - $5000.00 W/ $9024.09 REMAINING COPAY $200.00 COINSURANCE - 100%  PER AUTOMATED SYSTEM, NO PRECERT IS REQUIRED.  CALL REF # [id number]

## 2019-11-17 DIAGNOSIS — R69 Illness, unspecified: Secondary | ICD-10-CM | POA: Diagnosis not present

## 2019-11-19 ENCOUNTER — Other Ambulatory Visit: Payer: Self-pay | Admitting: Podiatry

## 2019-11-19 MED ORDER — CEPHALEXIN 500 MG PO CAPS: 500.0000 mg | ORAL_CAPSULE | Freq: Three times a day (TID) | ORAL | 0 refills | Status: DC

## 2019-11-19 MED ORDER — HYDROCODONE-ACETAMINOPHEN 10-325 MG PO TABS
1.0000 | ORAL_TABLET | Freq: Four times a day (QID) | ORAL | 0 refills | Status: AC | PRN
Start: 2019-11-19 — End: 2019-11-26

## 2019-11-19 MED ORDER — ONDANSETRON HCL 4 MG PO TABS: 4.0000 mg | ORAL_TABLET | Freq: Three times a day (TID) | ORAL | 0 refills | Status: AC | PRN

## 2019-11-21 DIAGNOSIS — M19072 Primary osteoarthritis, left ankle and foot: Secondary | ICD-10-CM | POA: Diagnosis not present

## 2019-11-21 DIAGNOSIS — M79675 Pain in left toe(s): Secondary | ICD-10-CM | POA: Diagnosis not present

## 2019-11-21 DIAGNOSIS — M25572 Pain in left ankle and joints of left foot: Secondary | ICD-10-CM | POA: Diagnosis not present

## 2019-11-21 DIAGNOSIS — M2042 Other hammer toe(s) (acquired), left foot: Secondary | ICD-10-CM

## 2019-11-27 ENCOUNTER — Other Ambulatory Visit: Payer: Self-pay

## 2019-11-27 ENCOUNTER — Ambulatory Visit (INDEPENDENT_AMBULATORY_CARE_PROVIDER_SITE_OTHER): Payer: Medicare HMO | Admitting: Podiatry

## 2019-11-27 DIAGNOSIS — Z9889 Other specified postprocedural states: Secondary | ICD-10-CM

## 2019-11-27 DIAGNOSIS — Z89422 Acquired absence of other left toe(s): Secondary | ICD-10-CM | POA: Diagnosis not present

## 2019-11-27 DIAGNOSIS — M2042 Other hammer toe(s) (acquired), left foot: Secondary | ICD-10-CM

## 2019-11-27 NOTE — Progress Notes (Signed)
  Subjective:  Patient ID: Megan Craig, female    DOB: 09-19-1945,  MRN: [id number]  Chief Complaint  Patient presents with  . Routine Post Op    PT stated that she is doing great and that she denies any pain fever,nausea,voimiting and chills     DOS: 11/21/2019 Procedure: Amputation MPJ second toe left foot  74 y.o. female returns for post-op check.  Doing well, minimal pain.  Review of Systems: Negative except as noted in the HPI. Denies N/V/F/Ch.   Objective:  There were no vitals filed for this visit. There is no height or weight on file to calculate BMI. Constitutional Well developed. Well nourished.  Vascular Foot warm and well perfused. Capillary refill normal to all digits.   Neurologic Normal speech. Oriented to person, place, and time. Epicritic sensation to light touch grossly present bilaterally.  Dermatologic Skin healing well without signs of infection. Skin edges well coapted without signs of infection.  Orthopedic: Tenderness to palpation noted about the surgical site.    Assessment:   1. Hammer toe of left foot   2. Post-operative state   3. History of amputation of lesser toe of left foot (HCC)    Plan:  Patient was evaluated and treated and all questions answered.  S/p foot surgery left -Progressing as expected post-operatively. -WB Status: WBAT in CAM boot -Sutures: Left intact, should be able to remove next week with Dr. Al Corpus. -Medications: No refills needed -Foot redressed.  Return in about 1 week (around 12/04/2019).

## 2019-12-04 ENCOUNTER — Encounter: Payer: Self-pay | Admitting: Podiatry

## 2019-12-04 ENCOUNTER — Other Ambulatory Visit: Payer: Self-pay

## 2019-12-04 ENCOUNTER — Ambulatory Visit (INDEPENDENT_AMBULATORY_CARE_PROVIDER_SITE_OTHER): Payer: Medicare HMO | Admitting: Podiatry

## 2019-12-04 DIAGNOSIS — Z9889 Other specified postprocedural states: Secondary | ICD-10-CM

## 2019-12-04 DIAGNOSIS — M2042 Other hammer toe(s) (acquired), left foot: Secondary | ICD-10-CM

## 2019-12-06 NOTE — Progress Notes (Signed)
She presents today for her third postop visit she is status post amputation toe at the level of the metatarsophalangeal joint second left.  States that is doing good no trouble whatsoever.  Objective: Vital signs are stable she is alert and oriented x3.  There is no erythema edema cellulitis drainage or odor sutures are intact margins well coapted.  Sutures removed today margins remain well coapted there is no signs of infection.  Assessment: Well-healing surgical toe.  Plan: Follow-up with me in 2 weeks and allow her to get back to her regular shoes.

## 2019-12-30 ENCOUNTER — Ambulatory Visit (INDEPENDENT_AMBULATORY_CARE_PROVIDER_SITE_OTHER): Payer: Medicare HMO | Admitting: Podiatry

## 2019-12-30 ENCOUNTER — Encounter: Payer: Self-pay | Admitting: Podiatry

## 2019-12-30 ENCOUNTER — Other Ambulatory Visit: Payer: Self-pay

## 2019-12-30 DIAGNOSIS — Z9889 Other specified postprocedural states: Secondary | ICD-10-CM

## 2019-12-30 DIAGNOSIS — M2042 Other hammer toe(s) (acquired), left foot: Secondary | ICD-10-CM

## 2019-12-30 DIAGNOSIS — Z89422 Acquired absence of other left toe(s): Secondary | ICD-10-CM

## 2019-12-30 NOTE — Progress Notes (Signed)
She presents today for postop visit date of surgery 11/21/2019 status post amputation second metatarsal phalangeal joint level second toe left.  States that it does not bother me at all and feels so much better I am still happy with the outcome.  I can wear any shoe that I want to at this point.  Objective: Vital signs are stable she is alert and oriented x3 pulses are palpable.  Incision site is gone on to heal uneventfully there is no erythema cellulitis drainage or odor.  Assessment: Well-healing amputation second digit left.  Plan: Follow-up with me on an as-needed basis.

## 2020-02-16 DIAGNOSIS — E7800 Pure hypercholesterolemia, unspecified: Secondary | ICD-10-CM | POA: Diagnosis not present

## 2020-02-16 DIAGNOSIS — E039 Hypothyroidism, unspecified: Secondary | ICD-10-CM | POA: Diagnosis not present

## 2020-02-16 DIAGNOSIS — N289 Disorder of kidney and ureter, unspecified: Secondary | ICD-10-CM | POA: Diagnosis not present

## 2020-02-16 DIAGNOSIS — R69 Illness, unspecified: Secondary | ICD-10-CM | POA: Diagnosis not present

## 2020-04-20 ENCOUNTER — Other Ambulatory Visit: Payer: Self-pay

## 2020-04-20 ENCOUNTER — Encounter (HOSPITAL_COMMUNITY): Payer: Self-pay | Admitting: Emergency Medicine

## 2020-04-20 ENCOUNTER — Emergency Department (HOSPITAL_COMMUNITY)
Admission: EM | Admit: 2020-04-20 | Discharge: 2020-04-20 | Disposition: A | Discharge status: Home / Self Care | Payer: Medicare HMO | Attending: Emergency Medicine | Admitting: Emergency Medicine

## 2020-04-20 ENCOUNTER — Emergency Department (HOSPITAL_COMMUNITY): Payer: Medicare HMO

## 2020-04-20 DIAGNOSIS — S99922A Unspecified injury of left foot, initial encounter: Secondary | ICD-10-CM | POA: Diagnosis not present

## 2020-04-20 DIAGNOSIS — S99911A Unspecified injury of right ankle, initial encounter: Secondary | ICD-10-CM | POA: Diagnosis not present

## 2020-04-20 DIAGNOSIS — E039 Hypothyroidism, unspecified: Secondary | ICD-10-CM | POA: Diagnosis not present

## 2020-04-20 DIAGNOSIS — M7989 Other specified soft tissue disorders: Secondary | ICD-10-CM | POA: Diagnosis not present

## 2020-04-20 DIAGNOSIS — S93402A Sprain of unspecified ligament of left ankle, initial encounter: Secondary | ICD-10-CM

## 2020-04-20 DIAGNOSIS — Z743 Need for continuous supervision: Secondary | ICD-10-CM | POA: Diagnosis not present

## 2020-04-20 DIAGNOSIS — S8991XA Unspecified injury of right lower leg, initial encounter: Secondary | ICD-10-CM | POA: Diagnosis not present

## 2020-04-20 DIAGNOSIS — W5512XA Struck by horse, initial encounter: Secondary | ICD-10-CM | POA: Insufficient documentation

## 2020-04-20 DIAGNOSIS — Z79899 Other long term (current) drug therapy: Secondary | ICD-10-CM | POA: Diagnosis not present

## 2020-04-20 DIAGNOSIS — S81819A Laceration without foreign body, unspecified lower leg, initial encounter: Secondary | ICD-10-CM

## 2020-04-20 DIAGNOSIS — Z23 Encounter for immunization: Secondary | ICD-10-CM | POA: Insufficient documentation

## 2020-04-20 DIAGNOSIS — M25572 Pain in left ankle and joints of left foot: Secondary | ICD-10-CM | POA: Diagnosis not present

## 2020-04-20 DIAGNOSIS — S99921A Unspecified injury of right foot, initial encounter: Secondary | ICD-10-CM | POA: Diagnosis not present

## 2020-04-20 DIAGNOSIS — S93401A Sprain of unspecified ligament of right ankle, initial encounter: Secondary | ICD-10-CM | POA: Diagnosis not present

## 2020-04-20 DIAGNOSIS — R52 Pain, unspecified: Secondary | ICD-10-CM

## 2020-04-20 DIAGNOSIS — S81811A Laceration without foreign body, right lower leg, initial encounter: Secondary | ICD-10-CM | POA: Diagnosis not present

## 2020-04-20 DIAGNOSIS — I10 Essential (primary) hypertension: Secondary | ICD-10-CM | POA: Diagnosis not present

## 2020-04-20 DIAGNOSIS — S99912A Unspecified injury of left ankle, initial encounter: Secondary | ICD-10-CM | POA: Diagnosis not present

## 2020-04-20 MED ORDER — CEPHALEXIN 500 MG PO CAPS: 500.0000 mg | ORAL_CAPSULE | Freq: Four times a day (QID) | ORAL | 0 refills | Status: AC

## 2020-04-20 MED ORDER — ACETAMINOPHEN 325 MG PO TABS
650.0000 mg | ORAL_TABLET | Freq: Once | ORAL | Status: AC
  Administered 2020-04-20: 650 mg via ORAL
  Filled 2020-04-20: qty 2

## 2020-04-20 MED ORDER — TETANUS-DIPHTH-ACELL PERTUSSIS 5-2.5-18.5 LF-MCG/0.5 IM SUSY
0.5000 mL | PREFILLED_SYRINGE | Freq: Once | INTRAMUSCULAR | Status: AC
  Administered 2020-04-20: 0.5 mL via INTRAMUSCULAR
  Filled 2020-04-20: qty 0.5

## 2020-04-20 MED ORDER — LIDOCAINE HCL (PF) 1 % IJ SOLN
2.0000 mL | Freq: Once | INTRAMUSCULAR | Status: DC
  Filled 2020-04-20: qty 5

## 2020-04-20 NOTE — ED Triage Notes (Signed)
DaughterVerlon Au [phone]

## 2020-04-20 NOTE — ED Notes (Signed)
Cleaned pts wound up with saline

## 2020-04-20 NOTE — Discharge Instructions (Addendum)
I have attached some information on how to rehab your sprained ankle.  Please reference this with any questions.  I would get your stitches removed in about 10 to 12 days.  You can have your primary care provider do this for you.  Please make sure you keep your wound clean and dry.  Please apply bacitracin as well as the bandages I provided.  If you develop any signs or symptoms of infection, please call your regular doctor.  I prescribed you an antibiotic called Keflex.  You can take this 4 times a day for the next 7 days.  Do not stop taking it early.  This will help prevent infection in the wound.  If you develop worsening symptoms, you can always return to the emergency department.  It was a pleasure to meet you.

## 2020-04-20 NOTE — ED Notes (Signed)
Ortho tech notified for pts cam boot.

## 2020-04-20 NOTE — ED Notes (Signed)
X-ray called and states pt now c/o pain to bilateral feet.  Additional x-rays ordered.

## 2020-04-20 NOTE — ED Triage Notes (Addendum)
Pt to triage via [facility].  She was knocked down by her horse that she was putting in a pen. Then the horse stepped on her R ankle.  C/o pain to bilateral ankles with a laceration to R ankle.  Bleeding controlled and bandage in place by EMS.  Denies LOC.  Bruising and swelling to L medial ankle.

## 2020-04-20 NOTE — ED Provider Notes (Signed)
[facility] EMERGENCY DEPARTMENT Provider Note   CSN: [id number] Arrival date & time: 04/20/20  1815     History Chief Complaint  Patient presents with  . Ankle Pain    Knocked down by horse    Megan Craig is a 75 y.o. female.  HPI Patient is a 75 year old female who presents the emergency department due to ankle pain.  Patient states she was opening a gate and letting horses out and was knocked over by a horse and had her right ankle stepped on by the horse.  She reports bilateral ankle pain, right greater than left.  Worsens with ambulation.  States she is having difficulty bearing weight with the right foot.  No numbness, tingling, or weakness.  Also reports a small laceration to the lower part of the right shin.  Bleeding controlled with direct pressure.  No other complaints at this time.  No head trauma or LOC.  She is not anticoagulated.     Past Medical History:  Diagnosis Date  . Arthritis   . Depression   . Hyperlipidemia   . Hypothyroidism   . Paraesophageal hernia   . Thyroid disease     Patient Active Problem List   Diagnosis Date Noted  . S/P repair of paraesophageal hernia 02/18/2018    Past Surgical History:  Procedure Laterality Date  . NASAL SEPTUM SURGERY    . ROTATOR CUFF REPAIR Right   . TUBAL LIGATION  age 59     OB History   No obstetric history on file.     No family history on file.  Social History   Tobacco Use  . Smoking status: Never Smoker  . Smokeless tobacco: Never Used  Vaping Use  . Vaping Use: Never used  Substance Use Topics  . Alcohol use: Yes    Comment: 3 ounces wine q night  . Drug use: No    Home Medications Prior to Admission medications   Medication Sig Start Date End Date Taking? Authorizing Provider  cephALEXin (KEFLEX) 500 MG capsule Take 1 capsule (500 mg total) by mouth 4 (four) times daily for 7 days. 04/20/20 04/27/20 Yes Placido Sou, PA-C  atorvastatin (LIPITOR) 20 MG tablet  Take 20 mg by mouth daily. 08/06/19   [provider]  buPROPion (WELLBUTRIN XL) 300 MG 24 hr tablet Take 300 mg by mouth daily.    [provider]  Calcium Carbonate-Vitamin D 600-400 MG-UNIT tablet Take 1 tablet by mouth 2 (two) times daily.    [provider]  citalopram (CELEXA) 10 MG tablet Take 10 mg by mouth daily. 11/03/19   [provider]  hydrOXYzine (ATARAX/VISTARIL) 25 MG tablet Take 25 mg by mouth at bedtime. 09/07/19   [provider]  levothyroxine (SYNTHROID, LEVOTHROID) 50 MCG tablet Take 50 mcg by mouth daily before breakfast.    [provider]  Multiple Vitamin (MULTIVITAMIN WITH MINERALS) TABS tablet Take 1 tablet by mouth daily. Alive Multivitamin    [provider]  Omega-3 Fatty Acids (EQL OMEGA 3 FISH OIL) 1400 MG CAPS Take 1,400 mg by mouth daily.    [provider]  ondansetron (ZOFRAN) 4 MG tablet Take 1 tablet (4 mg total) by mouth every 8 (eight) hours as needed. 11/19/19   Hyatt, Max T, DPM  pantoprazole (PROTONIX) 40 MG tablet Take 40 mg by mouth daily. 09/03/19   [provider]    Allergies    Patient has no known allergies.  Review of  Systems   Review of Systems  All other systems reviewed and are negative. Ten systems reviewed and are negative for acute change, except as noted in the HPI.    Physical Exam Updated Vital Signs BP 123/67 (BP Location: Right Arm)   Pulse 61   Temp 98.5 F (36.9 C) (Oral)   Resp 18   SpO2 99%   Physical Exam Vitals and nursing note reviewed.  Constitutional:      General: She is not in acute distress.    Appearance: She is well-developed.  HENT:     Head: Normocephalic and atraumatic.     Right Ear: External ear normal.     Left Ear: External ear normal.  Eyes:     General: No scleral icterus.       Right eye: No discharge.        Left eye: No discharge.     Conjunctiva/sclera: Conjunctivae normal.  Neck:     Trachea: No tracheal  deviation.  Cardiovascular:     Rate and Rhythm: Normal rate and regular rhythm.     Pulses: Normal pulses.     Heart sounds: Normal heart sounds. No murmur heard. No friction rub. No gallop.   Pulmonary:     Effort: Pulmonary effort is normal. No respiratory distress.     Breath sounds: Normal breath sounds. No stridor. No wheezing, rhonchi or rales.  Abdominal:     General: Abdomen is flat. There is no distension.  Musculoskeletal:        General: Swelling and tenderness present. No deformity.     Cervical back: Normal range of motion and neck supple.     Comments: Right ankle: Moderate ecchymosis, swelling, as well as diffuse tenderness noted along the lateral malleolus.  Additional moderate tenderness noted along the dorsum of the right ankle through the dorsum of the right foot.  Palpable DP pulses.  Distal sensation intact.  Patient able to move all the toes of the right foot.  Unable to assess range of motion of the right ankle due to pain.  Left ankle: Mild ecchymosis with mild tenderness noted diffusely along the medial malleolus of the ankle.  Full range of motion of the left ankle.  Patient able to move all the toes of the left foot.  Distal sensation intact.  Palpable pedal pulses.  Full range of motion of the bilateral hips and knees.  No midline C, T, L-spine tenderness.  Skin:    General: Skin is warm and dry.     Findings: No rash.     Comments: 2.5 cm laceration noted to the right lower shin.  Bleeding controlled with direct pressure.  Neurological:     Mental Status: She is alert.     Cranial Nerves: Cranial nerve deficit: no gross deficits.    ED Results / Procedures / Treatments   Labs (all labs ordered are listed, but only abnormal results are displayed) Labs Reviewed - No data to display  EKG None  Radiology DG Ankle Complete Left  Result Date: 04/20/2020 CLINICAL DATA:  Knocked down and stepped on by a horse, left ankle pain EXAM: LEFT ANKLE COMPLETE -  3+ VIEW COMPARISON:  None. FINDINGS: Frontal, oblique, and lateral views of the left ankle are obtained. No acute fracture, subluxation, or dislocation. Joint spaces are well preserved. Mild anteromedial soft tissue swelling. Minimal atherosclerosis. IMPRESSION: 1. Anteromedial soft tissue swelling.  No acute fracture. Electronically Signed   By: Sharlet Salina M.D.   On:  04/20/2020 19:10   DG Ankle Complete Right  Result Date: 04/20/2020 CLINICAL DATA:  Not down and stepped on by a horse EXAM: RIGHT ANKLE - COMPLETE 3+ VIEW COMPARISON:  None. FINDINGS: Frontal, oblique, and lateral views of the right ankle are obtained. No fracture, subluxation, or dislocation. Joint spaces are well preserved. Bandage material overlies the soft tissues of the right lower leg. Minimal atherosclerosis. IMPRESSION: 1. No acute displaced fracture. Electronically Signed   By: Sharlet Salina M.D.   On: 04/20/2020 19:11   DG Foot Complete Left  Result Date: 04/20/2020 CLINICAL DATA:  Not down and stepped on by a horse EXAM: LEFT FOOT - COMPLETE 3+ VIEW COMPARISON:  10/09/2019 FINDINGS: Frontal, oblique, and lateral views of the left foot are obtained. Prior amputation at the head of the second metatarsal. Stable screw within the head of the first metatarsal. There are no acute displaced fractures. Mild hallux valgus deformity unchanged. Stable mild osteoarthritis of the first metatarsophalangeal joint. Soft tissues are unremarkable. IMPRESSION: 1. Postsurgical changes as above.  No acute bony abnormalities. 2. Mild osteoarthritis. Electronically Signed   By: Sharlet Salina M.D.   On: 04/20/2020 19:12   DG Foot Complete Right  Result Date: 04/20/2020 CLINICAL DATA:  Knocked down and stepped on by horse EXAM: RIGHT FOOT COMPLETE - 3+ VIEW COMPARISON:  10/09/2019 FINDINGS: Frontal, oblique, lateral views of the right foot are obtained. Stable osteoarthritis of the first and second digits, most pronounced at the second proximal  interphalangeal joint. No acute fracture, subluxation, or dislocation. Soft tissues are unremarkable. IMPRESSION: 1. Stable osteoarthritis.  No acute displaced fracture. Electronically Signed   By: Sharlet Salina M.D.   On: 04/20/2020 19:13    Procedures .Marland KitchenLaceration Repair  Date/Time: 04/21/2020 12:42 AM Performed by: Placido Sou, PA-C Authorized by: Placido Sou, PA-C   Consent:    Consent obtained:  Verbal   Consent given by:  Patient   Risks discussed:  Infection, pain and poor cosmetic result Universal protocol:    Procedure explained and questions answered to patient or proxy's satisfaction: yes     Relevant documents present and verified: yes     Test results available: no     Imaging studies available: yes     Required blood products, implants, devices, and special equipment available: yes     Site/side marked: yes     Immediately prior to procedure, a time out was called: yes     Patient identity confirmed:  Verbally with patient Anesthesia:    Anesthesia method:  Local infiltration   Local anesthetic:  Lidocaine 1% w/o epi Laceration details:    Location:  Leg   Leg location:  R lower leg   Length (cm):  2.5 Pre-procedure details:    Preparation:  Patient was prepped and draped in usual sterile fashion Exploration:    Contaminated: no   Treatment:    Amount of cleaning:  Extensive   Irrigation solution:  Sterile saline   Debridement:  None Skin repair:    Repair method:  Sutures   Suture size:  3-0   Suture material:  Prolene   Suture technique:  Simple interrupted   Number of sutures:  3 Approximation:    Approximation:  Close Repair type:    Repair type:  Simple Post-procedure details:    Dressing:  Non-adherent dressing   Procedure completion:  Tolerated well, no immediate complications     Medications Ordered in ED Medications  lidocaine (PF) (XYLOCAINE) 1 % injection 2  mL (has no administration in time range)  Tdap (BOOSTRIX) injection  0.5 mL (0.5 mLs Intramuscular Given 04/20/20 2117)  acetaminophen (TYLENOL) tablet 650 mg (650 mg Oral Given 04/20/20 2120)    ED Course  I have reviewed the triage vital signs and the nursing notes.  Pertinent labs & imaging results that were available during my care of the patient were reviewed by me and considered in my medical decision making (see chart for details).    MDM Rules/Calculators/A&P                          Patient is a 75 year old female who presents the emergency department with a small laceration to the right lower leg as well as bilateral ankle pain after an accident where a horse stepped on her right ankle.  X-rays were obtained of the bilateral feet and ankles and were all reassuring.  No acute fractures noted.  Unsure of the timing of her last Tdap so this was updated in the emergency department.  The laceration on her leg was cleaned extensively by nursing staff as well as myself.  3, 3-0 Prolene sutures were placed for wound closure.  Patient tolerated the procedure quite well.  Patient discharged on a 7-day course of Keflex to help prevent infection.  Suture removal in 10 to 12 days.  We discussed return precautions as well as signs and symptoms of infection.  Patient verbalized understanding of the above plan.  She was placed in a cam boot prior to discharge.  Her questions were answered and she was amicable at the time of discharge.  Final Clinical Impression(s) / ED Diagnoses Final diagnoses:  Cut of lower leg  Sprain of right ankle, unspecified ligament, initial encounter  Sprain of left ankle, unspecified ligament, initial encounter   Rx / DC Orders ED Discharge Orders         Ordered    cephALEXin (KEFLEX) 500 MG capsule  4 times daily        04/20/20 2251           Placido SouJoldersma, Logan, PA-C 04/21/20 0046    Milagros Lollykstra, Richard S, MD 04/23/20 [phone]

## 2020-04-20 NOTE — ED Notes (Signed)
Patient verbalized understanding of discharge instructions. Opportunity for questions and answers.  

## 2020-04-21 NOTE — Progress Notes (Signed)
Orthopedic Tech Progress Note Patient Details:  BOBBY RAGAN 1945/12/12 [id number]  Ortho Devices Type of Ortho Device: CAM walker Ortho Device/Splint Location: R LE Ortho Device/Splint Interventions: Ordered,Application,Adjustment   Post Interventions Patient Tolerated: Well Instructions Provided: Adjustment of device,Poper ambulation with device,Care of device  Pt tolerated application well.  Assisted in ambulating a short distance to make sure she did not need an assistive device.  She says her left leg is sore as well, but she did well without a device as long as she took it slow.   Thanks,  Corinna Capra, PT, DPT  Acute Rehabilitation [phone] pager #[phone] office     Lurena Joiner B Darien Mignogna 04/21/2020, 12:22 AM

## 2020-08-13 DIAGNOSIS — Z0000 Encounter for general adult medical examination without abnormal findings: Secondary | ICD-10-CM | POA: Diagnosis not present

## 2020-08-13 DIAGNOSIS — K219 Gastro-esophageal reflux disease without esophagitis: Secondary | ICD-10-CM | POA: Diagnosis not present

## 2020-08-13 DIAGNOSIS — E039 Hypothyroidism, unspecified: Secondary | ICD-10-CM | POA: Diagnosis not present

## 2020-08-13 DIAGNOSIS — R69 Illness, unspecified: Secondary | ICD-10-CM | POA: Diagnosis not present

## 2020-08-13 DIAGNOSIS — E7800 Pure hypercholesterolemia, unspecified: Secondary | ICD-10-CM | POA: Diagnosis not present

## 2020-08-13 DIAGNOSIS — M8580 Other specified disorders of bone density and structure, unspecified site: Secondary | ICD-10-CM | POA: Diagnosis not present

## 2020-08-13 DIAGNOSIS — Z13820 Encounter for screening for osteoporosis: Secondary | ICD-10-CM | POA: Diagnosis not present

## 2020-10-20 DIAGNOSIS — Z1231 Encounter for screening mammogram for malignant neoplasm of breast: Secondary | ICD-10-CM | POA: Diagnosis not present

## 2020-10-27 DIAGNOSIS — M85852 Other specified disorders of bone density and structure, left thigh: Secondary | ICD-10-CM | POA: Diagnosis not present

## 2020-10-27 DIAGNOSIS — M85851 Other specified disorders of bone density and structure, right thigh: Secondary | ICD-10-CM | POA: Diagnosis not present

## 2020-10-31 IMAGING — RF DG UGI W/ HIGH DENSITY W/KUB
7 series · 14 of 18 positions shown · non-contrast
Comparison: Ultrasound of the abdomen 06/11/2017

CLINICAL DATA: History of hiatal hernia, evaluate size and location

EXAM:
UPPER GI SERIES WITH KUB
TECHNIQUE: After obtaining a scout radiograph a routine upper GI series was
performed using thin and high density barium.
FLUOROSCOPY TIME:  Fluoroscopy Time:  2 minutes 6 seconds
Radiation Exposure Index (if provided by the fluoroscopic device):
109 mGy
Number of Acquired Spot Images: 0

[Series 1: one shot · 0.14mm/px · 1 of 1 slices shown (1 of 3)]
[im 1/1]
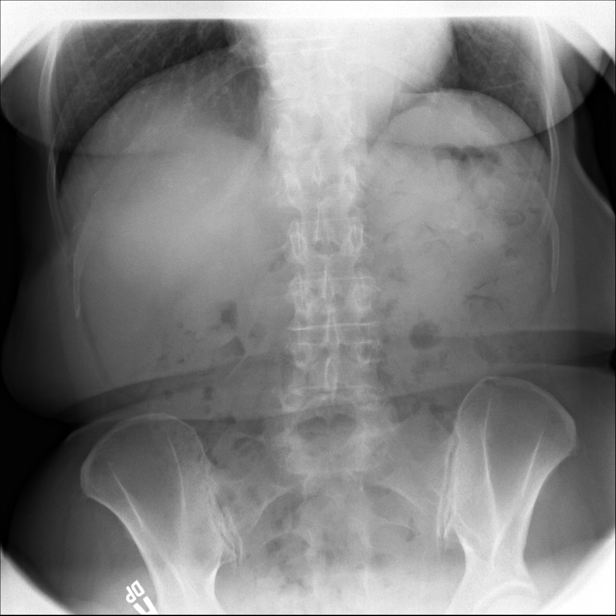

[Series 2: sequence · 0.29mm/px · 3 of 20 frames shown (1 of 4)]
[frame 4/20]
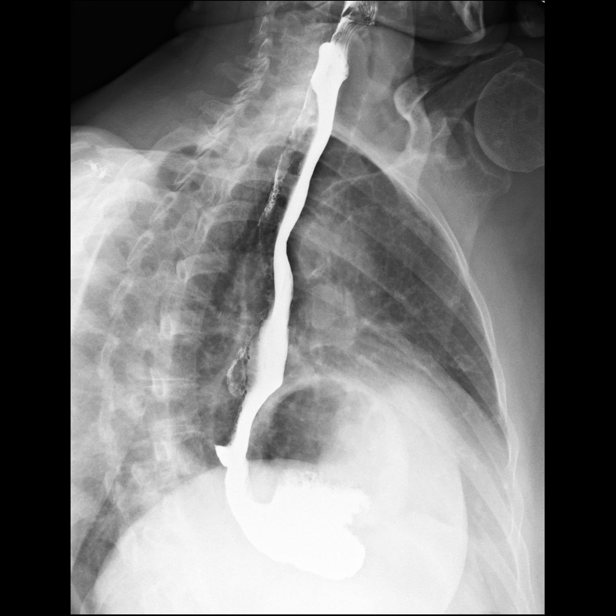
[frame 17/20]
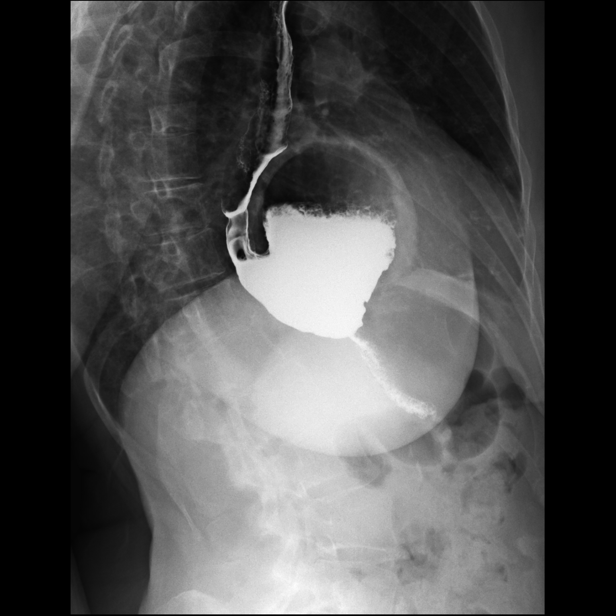
[frame 18/20]
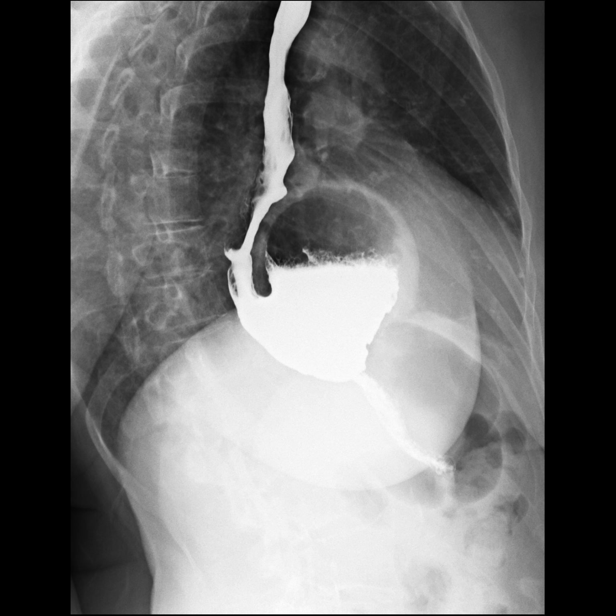

[Series 3: one shot · 0.15mm/px · 3 of 4 slices shown (2 of 3)]
[im 1/4]
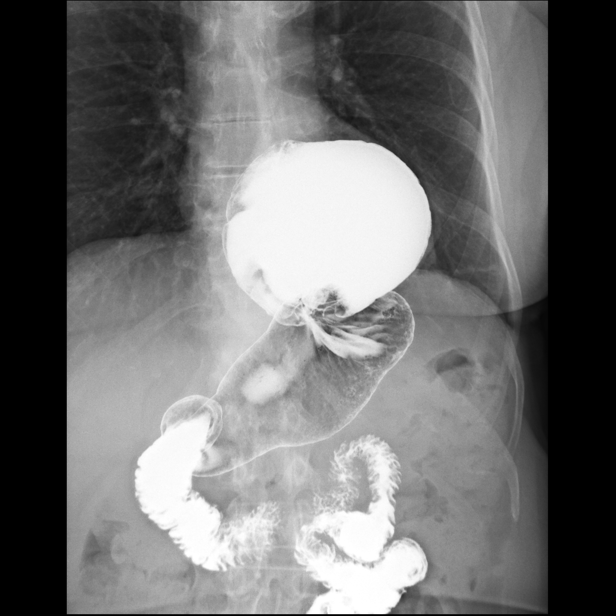
[im 3/4]
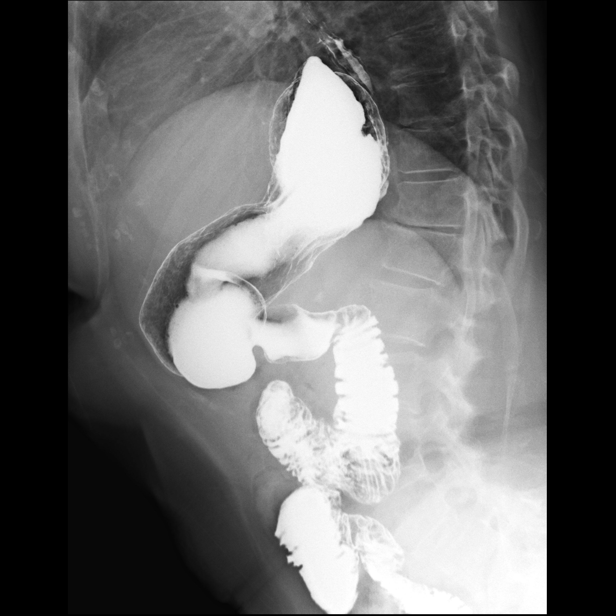
[im 4/4]
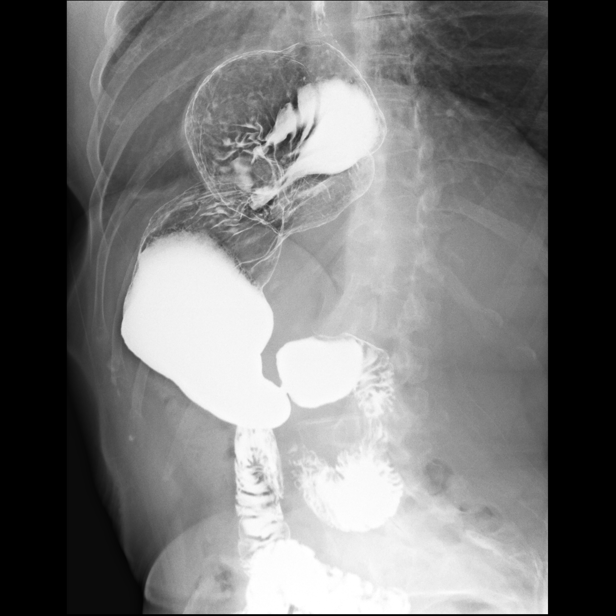

[Series 4: sequence · 0.29mm/px · 2 of 15 frames shown (2 of 4)]
[frame 3/15]
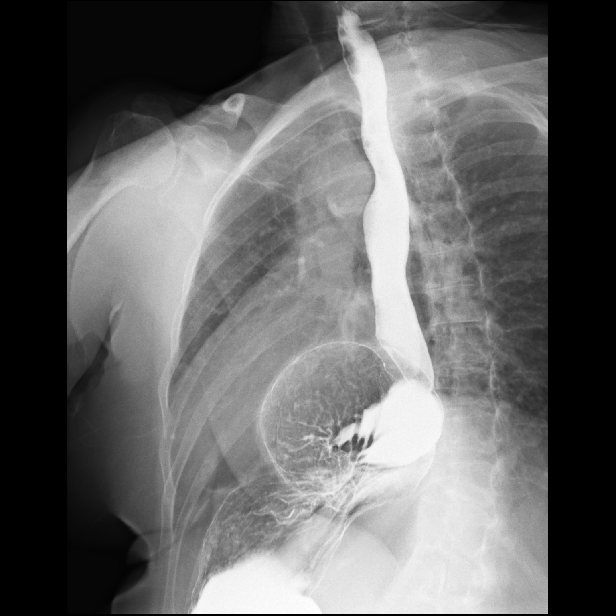
[frame 8/15]
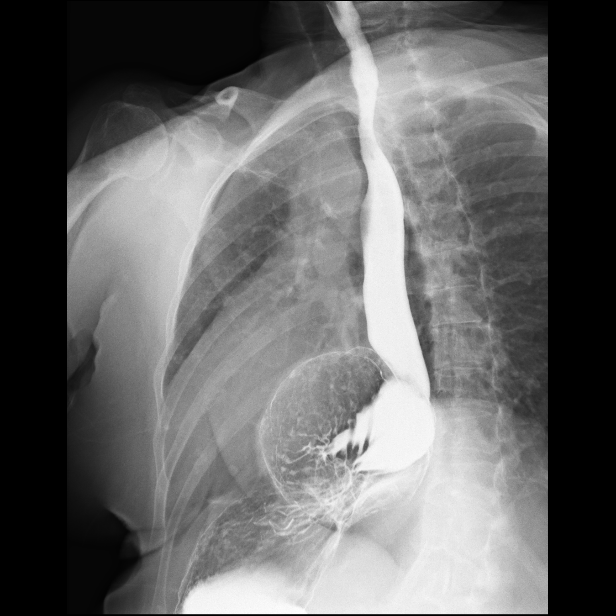

[Series 5: sequence · 0.29mm/px · 1 of 1 slices shown (3 of 4)]
[im 1/1]
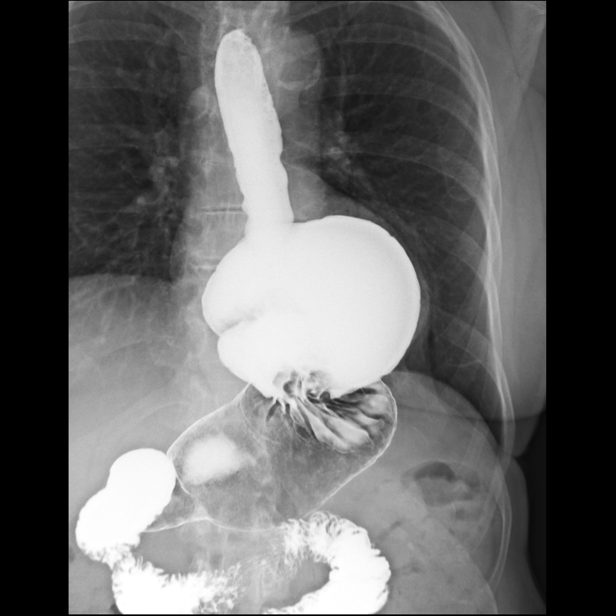

[Series 6: sequence · 0.29mm/px · 1 of 1 slices shown (4 of 4)]
[im 1/1]
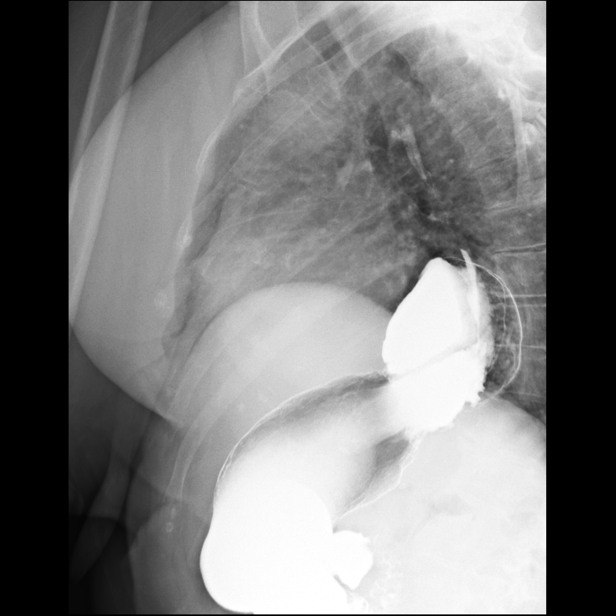

[Series 7: one shot · 3 of 4 slices shown (3 of 3)]
[im 1/4]
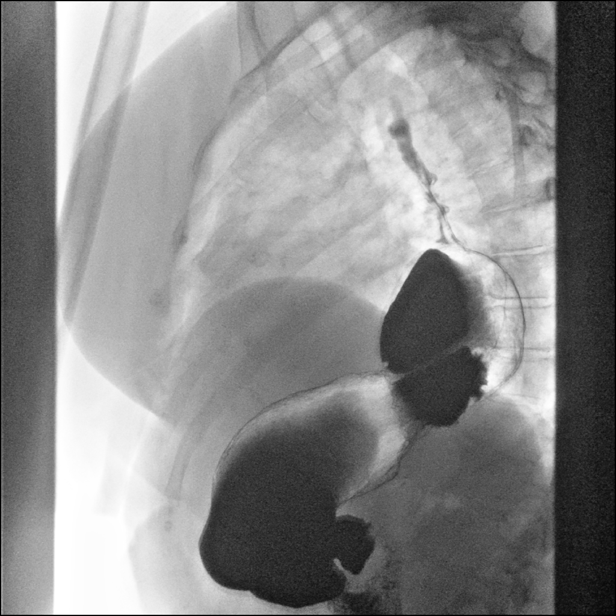
[im 3/4]
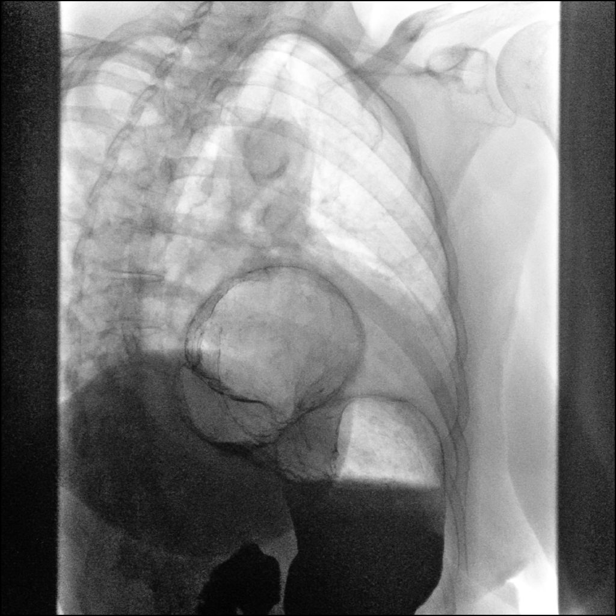
[im 4/4]
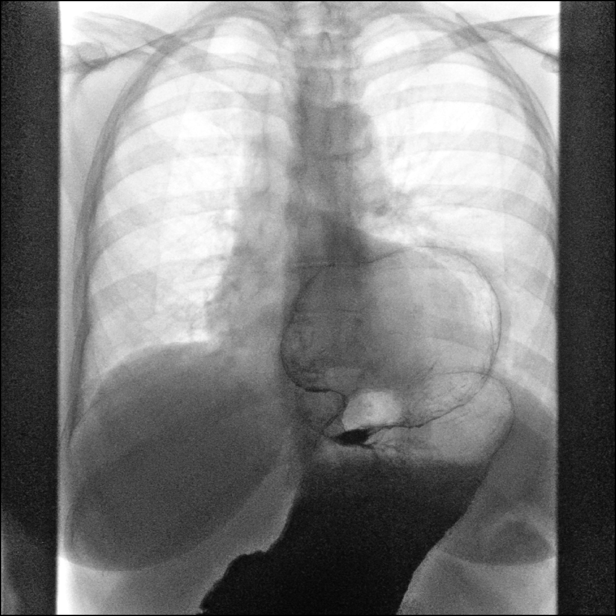

[14 of 18 positions shown; findings below may reference images not displayed]

FINDINGS: A preliminary film of the abdomen shows a nonspecific bowel gas
pattern. No opaque calculi are seen. The bones are unremarkable.

Initially, a double-contrast barium swallow was performed. There are
mild tertiary contractions within the mid and distal esophagus.
There is a moderate paraesophageal hernia present. There is also
persistent narrowing of the distal esophagus just above the
paraesophageal hernia consistent with stricture.

The stomach Kinah well with barium and is well distended with air.
Other than the moderately large paraesophageal hernia, no
abnormality of the stomach is seen. The duodenal bulb fills well and
the duodenal loop is in normal position.

With the water siphon maneuver, moderate gastroesophageal reflux is
demonstrated. A barium pill was given at the end of the study which
did lodge in the distal esophagus consistent with a distal
esophageal stricture.
IMPRESSION: 1. Moderately large paraesophageal hernia with moderate
gastroesophageal reflux.
2. Barium pill does lodge within the distal esophagus consistent
with short segment distal esophageal stricture.
3. Mild tertiary contractions.
4. No abnormality other than the described paraesophageal hernia
involving the stomach or duodenum.

## 2020-11-18 DIAGNOSIS — Z008 Encounter for other general examination: Secondary | ICD-10-CM | POA: Diagnosis not present

## 2020-11-18 DIAGNOSIS — K219 Gastro-esophageal reflux disease without esophagitis: Secondary | ICD-10-CM | POA: Diagnosis not present

## 2020-11-18 DIAGNOSIS — R69 Illness, unspecified: Secondary | ICD-10-CM | POA: Diagnosis not present

## 2020-11-18 DIAGNOSIS — F324 Major depressive disorder, single episode, in partial remission: Secondary | ICD-10-CM | POA: Diagnosis not present

## 2020-11-18 DIAGNOSIS — E039 Hypothyroidism, unspecified: Secondary | ICD-10-CM | POA: Diagnosis not present

## 2020-11-18 DIAGNOSIS — E785 Hyperlipidemia, unspecified: Secondary | ICD-10-CM | POA: Diagnosis not present

## 2020-11-18 DIAGNOSIS — R030 Elevated blood-pressure reading, without diagnosis of hypertension: Secondary | ICD-10-CM | POA: Diagnosis not present

## 2020-11-18 DIAGNOSIS — R32 Unspecified urinary incontinence: Secondary | ICD-10-CM | POA: Diagnosis not present

## 2020-11-18 DIAGNOSIS — M8580 Other specified disorders of bone density and structure, unspecified site: Secondary | ICD-10-CM | POA: Diagnosis not present

## 2020-11-18 DIAGNOSIS — Z89421 Acquired absence of other right toe(s): Secondary | ICD-10-CM | POA: Diagnosis not present

## 2020-11-18 DIAGNOSIS — F419 Anxiety disorder, unspecified: Secondary | ICD-10-CM | POA: Diagnosis not present

## 2020-11-18 DIAGNOSIS — M1990 Unspecified osteoarthritis, unspecified site: Secondary | ICD-10-CM | POA: Diagnosis not present

## 2020-12-21 DIAGNOSIS — H5202 Hypermetropia, left eye: Secondary | ICD-10-CM | POA: Diagnosis not present

## 2020-12-23 DIAGNOSIS — H353131 Nonexudative age-related macular degeneration, bilateral, early dry stage: Secondary | ICD-10-CM | POA: Diagnosis not present

## 2021-02-09 DIAGNOSIS — U071 COVID-19: Secondary | ICD-10-CM | POA: Diagnosis not present

## 2021-05-19 ENCOUNTER — Ambulatory Visit (INDEPENDENT_AMBULATORY_CARE_PROVIDER_SITE_OTHER): Payer: Medicare HMO

## 2021-05-19 ENCOUNTER — Encounter: Payer: Self-pay | Admitting: Podiatry

## 2021-05-19 ENCOUNTER — Ambulatory Visit: Payer: Medicare HMO | Admitting: Podiatry

## 2021-05-19 DIAGNOSIS — M2042 Other hammer toe(s) (acquired), left foot: Secondary | ICD-10-CM

## 2021-05-19 DIAGNOSIS — M2012 Hallux valgus (acquired), left foot: Secondary | ICD-10-CM | POA: Diagnosis not present

## 2021-05-22 NOTE — Progress Notes (Signed)
She presents today concern of her bunion deformity of her left foot seems to be getting worse and she says that the third toe is now overlapping since she had amputation of the second toe. ? ?Objective: Vital signs are stable alert and oriented x3.  Pulses are palpable.  She obviously has an overlapping third digit of the left foot which is now overlapping the hallux.  Most likely secondary to the necessary removal of the second metatarsal head.  This is allowing for medial deviation of the third toe.  The hallux abductovalgus does not appear to be worsened radiographically.  It appears that the lesser toes are moving more medially. ? ?Assessment: Complete dislocation of the third metatarsal phalangeal joint. ? ?Plan: Discussed with her in great detail today that it is really not the first metatarsophalangeal joint of the lesser digits.  She understands and is amenable to it she is not amenable to another amputation of the third toe.  However I did recommend that she follow-up with Dr. Lilian Kapur to see if he could possibly perform strapping procedure to hold the toe down.  He discussed this with him in detail this afternoon and he certainly is willing to see her and see if there are any options short of amputation. ?

## 2021-06-06 DIAGNOSIS — K219 Gastro-esophageal reflux disease without esophagitis: Secondary | ICD-10-CM | POA: Diagnosis not present

## 2021-06-07 ENCOUNTER — Ambulatory Visit: Payer: Medicare HMO | Admitting: Podiatry

## 2021-06-07 DIAGNOSIS — S99922A Unspecified injury of left foot, initial encounter: Secondary | ICD-10-CM

## 2021-06-07 DIAGNOSIS — M21612 Bunion of left foot: Secondary | ICD-10-CM

## 2021-06-07 DIAGNOSIS — M2042 Other hammer toe(s) (acquired), left foot: Secondary | ICD-10-CM

## 2021-06-07 DIAGNOSIS — M2012 Hallux valgus (acquired), left foot: Secondary | ICD-10-CM

## 2021-06-12 ENCOUNTER — Encounter: Payer: Self-pay | Admitting: Podiatry

## 2021-06-12 NOTE — Progress Notes (Signed)
?  Subjective:  ?Patient ID: Megan Craig, female    DOB: 1945-10-07,  MRN: [id number] ? ?Chief Complaint  ?Patient presents with  ? Bunions  ?   left foot bunion/ hammertoe discomfort  ? Hammer Toe  ? ? ?76 y.o. female presents with the above complaint. History confirmed with patient.  She was referred to me by Dr. Al Corpus for surgical consultation for a dislocated and deviated third toe.  She appears to have the same issue with her second toe and opted for elective amputation, following this she has developed deviation and subluxation of the third toe.  She also bunion surgery that was not successful many years ago by another practice ? ?Objective:  ?Physical Exam: ?warm, good capillary refill, no trophic changes or ulcerative lesions, normal DP and PT pulses, normal sensory exam, and left foot recurrent hallux valgus deformity, there is amputation of the second toe, there is also dislocation and subluxation and instability of the third MTPJ with hammertoe deformity ? ? ?Radiographs: ?Multiple views x-ray of the left foot: I reviewed her most recent radiographs there is hallux valgus deformity, second toe amputation and third toe to right with subluxation ?Assessment:  ? ?1. Injury of plantar plate of left foot, initial encounter   ?2. Hallux valgus with bunions, left   ?3. Hammertoe of left foot   ? ? ? ?Plan:  ?Patient was evaluated and treated and all questions answered. ? ?We reviewed clinical findings and today's graphs in detail we discussed that elective amputation of the next toe would likely continue to have this issue with the other toes.  Contributing factors here I think are her hallux valgus deformity that is recurrent.  We discussed the option of a first metatarsal fusion, however she does not want to lose the mobility of the big toe.  I do not think implant arthroplasty would offer much here either.  I recommended then revision bunion correction with minimally invasive approach as well as plantar  plate repair with flexor tendon transfer and hammertoe correction of the third toe.  We discussed the risk benefits and potential complications of this procedure include but limited to  pain, swelling, infection, scar, numbness which may be temporary or permanent, chronic pain, stiffness, nerve pain or damage, wound healing problems, bone healing problems including delayed or non-union.  She understands and wishes to proceed.  All questions were addressed.  Informed sent was signed and reviewed. ? ? ?Surgical plan: ? ?Procedure: ?-Left third toe hammertoe correction and plantar plate repair, MIS bunion correction, possible hardware removal ? ?Location: ?-[facility] ? ?Anesthesia plan: ?-IV sedation with regional block ? ?Postoperative pain plan: ?- Tylenol 1000 mg every 6 hours, gabapentin 300 mg every 8 hours x5 days, oxycodone 5 mg 1-2 tabs every 6 hours only as needed ? ?DVT prophylaxis: ?-None required ? ?WB Restrictions / DME needs: ?-WBAT in CAM boot which she has ? ? ? ?No follow-ups on file.  ? ?

## 2021-08-15 ENCOUNTER — Telehealth: Payer: Self-pay | Admitting: Urology

## 2021-08-15 NOTE — Telephone Encounter (Signed)
DOS - 09/09/21  DOUBLE OSTEOTOMY LEFT --- 28299 METATARSAL OSTEOTOMY 3RD LEFT --- 97026 REMOVAL FIXATION DEEP LEFT --- 20680 HAMMERTOE REPAIR 3RD LEFT --- 37858 RECONSTRUCTION ANGULAR DEFORMITY LEFT --- 85027  [employer] EFFECTIVE DATE - 02/07/20  PLAN DEDUCTIBLE - $0.00 OUT OF POCKET - $4,500.00 W/ $4,425.00 REMAINING COINSURANCE - 0% COPAY - $0.00    SPOKE WITH STEVE WITH [employer] AND HE STATED THAT FOR CPT CODES 74128, 28308, 20680, 78676 AND [phone] NO PRIOR AUTH IS REQUIRED.  REF # [id number]

## 2021-09-09 ENCOUNTER — Other Ambulatory Visit: Payer: Self-pay | Admitting: Podiatry

## 2021-09-09 DIAGNOSIS — M2012 Hallux valgus (acquired), left foot: Secondary | ICD-10-CM | POA: Diagnosis not present

## 2021-09-09 DIAGNOSIS — M21272 Flexion deformity, left ankle and toes: Secondary | ICD-10-CM | POA: Diagnosis not present

## 2021-09-09 DIAGNOSIS — M2042 Other hammer toe(s) (acquired), left foot: Secondary | ICD-10-CM | POA: Diagnosis not present

## 2021-09-09 DIAGNOSIS — G8918 Other acute postprocedural pain: Secondary | ICD-10-CM | POA: Diagnosis not present

## 2021-09-09 DIAGNOSIS — S93525A Sprain of metatarsophalangeal joint of left lesser toe(s), initial encounter: Secondary | ICD-10-CM | POA: Diagnosis not present

## 2021-09-09 DIAGNOSIS — M205X2 Other deformities of toe(s) (acquired), left foot: Secondary | ICD-10-CM | POA: Diagnosis not present

## 2021-09-09 DIAGNOSIS — M21542 Acquired clubfoot, left foot: Secondary | ICD-10-CM | POA: Diagnosis not present

## 2021-09-09 MED ORDER — ACETAMINOPHEN 500 MG PO TABS: 1000.0000 mg | ORAL_TABLET | Freq: Four times a day (QID) | ORAL | 0 refills | Status: AC | PRN

## 2021-09-09 MED ORDER — OXYCODONE HCL 5 MG PO TABS
5.0000 mg | ORAL_TABLET | ORAL | 0 refills | Status: AC | PRN
Start: 2021-09-09 — End: 2021-09-16

## 2021-09-09 MED ORDER — GABAPENTIN 300 MG PO CAPS: 300.0000 mg | ORAL_CAPSULE | Freq: Three times a day (TID) | ORAL | 0 refills | Status: DC

## 2021-09-09 NOTE — Progress Notes (Signed)
09/09/21 Left foot bunion revision 3rd toe repair

## 2021-09-15 ENCOUNTER — Ambulatory Visit (INDEPENDENT_AMBULATORY_CARE_PROVIDER_SITE_OTHER): Payer: Medicare HMO | Admitting: Podiatry

## 2021-09-15 ENCOUNTER — Encounter: Payer: Medicare HMO | Admitting: Podiatry

## 2021-09-15 ENCOUNTER — Ambulatory Visit (INDEPENDENT_AMBULATORY_CARE_PROVIDER_SITE_OTHER): Payer: Medicare HMO

## 2021-09-15 DIAGNOSIS — M2042 Other hammer toe(s) (acquired), left foot: Secondary | ICD-10-CM | POA: Diagnosis not present

## 2021-09-15 DIAGNOSIS — S99922A Unspecified injury of left foot, initial encounter: Secondary | ICD-10-CM | POA: Diagnosis not present

## 2021-09-15 DIAGNOSIS — M21612 Bunion of left foot: Secondary | ICD-10-CM | POA: Diagnosis not present

## 2021-09-15 DIAGNOSIS — M2012 Hallux valgus (acquired), left foot: Secondary | ICD-10-CM

## 2021-09-15 NOTE — Progress Notes (Signed)
  Subjective:  Patient ID: Megan Craig, female    DOB: 1945-02-14,  MRN: [id number]  Chief Complaint  Patient presents with   Routine Post Op    POV #1 DOS 09/09/2021 SCREW REMOVAL, REVIION BUNIONECTOMY, 3RD TOE CORRECTION, LIGAMENT REPAIR, BONE CUT IN METATARSAL LT FOOT     76 y.o. female returns for post-op check.  She is doing well pain is fairly well-controlled.  Has been using a knee scooter.  Review of Systems: Negative except as noted in the HPI. Denies N/V/F/Ch.   Objective:  There were no vitals filed for this visit. There is no height or weight on file to calculate BMI. Constitutional Well developed. Well nourished.  Vascular Foot warm and well perfused. Capillary refill normal to all digits.  Calf is soft and supple, no posterior calf or knee pain, negative Homans' sign  Neurologic Normal speech. Oriented to person, place, and time. Epicritic sensation to light touch grossly present bilaterally.  Dermatologic Skin healing well without signs of infection. Skin edges well coapted without signs of infection.  Orthopedic: Tenderness to palpation noted about the surgical site.   Multiple view plain film radiographs: Good correction of all deformities, hardware intact and in good position no complication noted and equivalent to immediate postop films Assessment:   1. Hallux valgus with bunions, left   2. Hammertoe of left foot   3. Injury of plantar plate of left foot, initial encounter    Plan:  Patient was evaluated and treated and all questions answered.  S/p foot surgery left -Progressing as expected post-operatively. -XR: Noted above -WB Status: She may begin to WBAT in short cam walker boot.  The current boot she had is quite old and effective and no longer supports her foot appropriately.  This was replaced today with a new CAM boot.  Her previous boot was not dispensed at this practice this was previously given to her after a foot injury from horse stepping on  her foot. -Sutures: Plan remove in 2 weeks. -Medications: No refills required -Foot redressed.  Return in about 2 weeks (around 09/29/2021) for suture removal.

## 2021-09-18 ENCOUNTER — Other Ambulatory Visit: Payer: Self-pay | Admitting: Podiatry

## 2021-09-19 NOTE — Telephone Encounter (Signed)
Please advise 

## 2021-09-29 ENCOUNTER — Encounter: Payer: Medicare HMO | Admitting: Podiatry

## 2021-09-29 ENCOUNTER — Ambulatory Visit (INDEPENDENT_AMBULATORY_CARE_PROVIDER_SITE_OTHER): Payer: Medicare HMO | Admitting: Podiatry

## 2021-09-29 DIAGNOSIS — M2042 Other hammer toe(s) (acquired), left foot: Secondary | ICD-10-CM

## 2021-09-29 DIAGNOSIS — M2012 Hallux valgus (acquired), left foot: Secondary | ICD-10-CM

## 2021-09-29 DIAGNOSIS — S99922A Unspecified injury of left foot, initial encounter: Secondary | ICD-10-CM

## 2021-09-29 DIAGNOSIS — M21612 Bunion of left foot: Secondary | ICD-10-CM

## 2021-09-29 NOTE — Progress Notes (Signed)
  Subjective:  Patient ID: Megan Craig, female    DOB: 06/13/1945,  MRN: [id number]  Chief Complaint  Patient presents with   Routine Post Op    POV #2 DOS 09/09/2021 SCREW REMOVAL, REVIION BUNIONECTOMY, 3RD TOE CORRECTION, LIGAMENT REPAIR, BONE CUT IN METATARSAL LT FOOT     76 y.o. female returns for post-op check.  Overall doing well pain remains controlled  Review of Systems: Negative except as noted in the HPI. Denies N/V/F/Ch.   Objective:  There were no vitals filed for this visit. There is no height or weight on file to calculate BMI. Constitutional Well developed. Well nourished.  Vascular Foot warm and well perfused. Capillary refill normal to all digits.  Calf is soft and supple, no posterior calf or knee pain, negative Homans' sign  Neurologic Normal speech. Oriented to person, place, and time. Epicritic sensation to light touch grossly present bilaterally.  Dermatologic Skin healing well without signs of infection. Skin edges well coapted without signs of infection.  Orthopedic: She is minimal tenderness to palpation noted about the surgical site.  Edema is much improved   Multiple view plain film radiographs: Good correction of all deformities, hardware intact and in good position no complication noted and equivalent to immediate postop films Assessment:   1. Hallux valgus with bunions, left   2. Hammertoe of left foot   3. Injury of plantar plate of left foot, initial encounter   4. Hammer toe of left foot    Plan:  Patient was evaluated and treated and all questions answered.  S/p foot surgery left -Sutures removed today.  She may WBAT in the cam boot.  No refills were sent.  I will see her back in 3 weeks for new radiographs and removal of the Kirschner wire.  Expect transition to weightbearing in regular shoe gear around week 8  Return in about 3 weeks (around 10/20/2021) for post op (new x-rays) pin removal .

## 2021-10-20 ENCOUNTER — Encounter: Payer: Medicare HMO | Admitting: Podiatry

## 2021-10-20 ENCOUNTER — Ambulatory Visit (INDEPENDENT_AMBULATORY_CARE_PROVIDER_SITE_OTHER): Payer: Medicare HMO

## 2021-10-20 ENCOUNTER — Ambulatory Visit (INDEPENDENT_AMBULATORY_CARE_PROVIDER_SITE_OTHER): Payer: Medicare HMO | Admitting: Podiatry

## 2021-10-20 DIAGNOSIS — M2012 Hallux valgus (acquired), left foot: Secondary | ICD-10-CM

## 2021-10-20 DIAGNOSIS — M21612 Bunion of left foot: Secondary | ICD-10-CM

## 2021-10-23 NOTE — Progress Notes (Signed)
  Subjective:  Patient ID: Megan Craig, female    DOB: 1945-06-17,  MRN: [id number]  Chief Complaint  Patient presents with   Routine Post Op      POV #3 DOS 09/09/2021 SCREW REMOVAL, REVIION BUNIONECTOMY, 3RD TOE CORRECTION, LIGAMENT REPAIR, BONE CUT IN METATARSAL LT FOOT     76 y.o. female returns for post-op check.  Doing very well not having much pain at all  Review of Systems: Negative except as noted in the HPI. Denies N/V/F/Ch.   Objective:  There were no vitals filed for this visit. There is no height or weight on file to calculate BMI. Constitutional Well developed. Well nourished.  Vascular Foot warm and well perfused. Capillary refill normal to all digits.  Calf is soft and supple, no posterior calf or knee pain, negative Homans' sign  Neurologic Normal speech. Oriented to person, place, and time. Epicritic sensation to light touch grossly present bilaterally.  Dermatologic Incisions are well-healed not hypertrophic  Orthopedic: No pain to palpation good range of motion, edema improving   Multiple view plain film radiographs: Correction maintained no complication of hardware, there is good consolidation across PIPJ arthrodesis site, there is no significant bone callus formation yet around the osteotomy of the first metatarsal Assessment:   1. Hallux valgus with bunions, left    Plan:  Patient was evaluated and treated and all questions answered.  S/p foot surgery left -Overall doing fairly well.  Kirschner wire was removed uneventfully.  Recommend she remain in the cam boot until we see increasing bone callus formation.  Would like to see her back in 4 weeks for new x-rays we will likely transition to surgical shoe at that time  Return in about 4 weeks (around 11/17/2021) for post op (new x-rays).

## 2021-11-17 ENCOUNTER — Ambulatory Visit (INDEPENDENT_AMBULATORY_CARE_PROVIDER_SITE_OTHER): Payer: Medicare HMO | Admitting: Podiatry

## 2021-11-17 ENCOUNTER — Ambulatory Visit (INDEPENDENT_AMBULATORY_CARE_PROVIDER_SITE_OTHER): Payer: Medicare HMO

## 2021-11-17 DIAGNOSIS — M21612 Bunion of left foot: Secondary | ICD-10-CM | POA: Diagnosis not present

## 2021-11-17 DIAGNOSIS — M2042 Other hammer toe(s) (acquired), left foot: Secondary | ICD-10-CM

## 2021-11-17 DIAGNOSIS — S99922A Unspecified injury of left foot, initial encounter: Secondary | ICD-10-CM

## 2021-11-17 DIAGNOSIS — M2012 Hallux valgus (acquired), left foot: Secondary | ICD-10-CM

## 2021-11-20 NOTE — Progress Notes (Signed)
  Subjective:  Patient ID: Megan Craig, female    DOB: 06/11/1945,  MRN: [id number]  Chief Complaint  Patient presents with   Routine Post Op    POV #4 DOS 09/09/2021 SCREW REMOVAL, REVIION BUNIONECTOMY, 3RD TOE CORRECTION, LIGAMENT REPAIR, BONE CUT IN METATARSAL LT FOOT     76 y.o. female returns for post-op check.  She is doing well  Review of Systems: Negative except as noted in the HPI. Denies N/V/F/Ch.   Objective:  There were no vitals filed for this visit. There is no height or weight on file to calculate BMI. Constitutional Well developed. Well nourished.  Vascular Foot warm and well perfused. Capillary refill normal to all digits.  Calf is soft and supple, no posterior calf or knee pain, negative Homans' sign  Neurologic Normal speech. Oriented to person, place, and time. Epicritic sensation to light touch grossly present bilaterally.  Dermatologic Incisions are well-healed not hypertrophic  Orthopedic: No pain to palpation good range of motion, edema improving   Multiple view plain film radiographs: Correction maintained no complication of hardware, new increased bone callus formation since last x-rays Assessment:   1. Hallux valgus with bunions, left   2. Hammertoe of left foot   3. Injury of plantar plate of left foot, initial encounter    Plan:  Patient was evaluated and treated and all questions answered.  S/p foot surgery left -Has increased bone callus formation.  Can transition out of surgical shoe back to a supportive shoe which we discussed.  Return in 3 months for follow-up x-rays  Return in about 3 months (around 02/17/2022) for f/u from left foot surgery (new x-rays).

## 2021-11-25 DIAGNOSIS — Z1231 Encounter for screening mammogram for malignant neoplasm of breast: Secondary | ICD-10-CM | POA: Diagnosis not present

## 2022-02-23 ENCOUNTER — Ambulatory Visit (INDEPENDENT_AMBULATORY_CARE_PROVIDER_SITE_OTHER): Payer: Medicare HMO

## 2022-02-23 ENCOUNTER — Ambulatory Visit: Payer: Medicare HMO | Admitting: Podiatry

## 2022-02-23 DIAGNOSIS — M2042 Other hammer toe(s) (acquired), left foot: Secondary | ICD-10-CM | POA: Diagnosis not present

## 2022-02-23 DIAGNOSIS — M21612 Bunion of left foot: Secondary | ICD-10-CM

## 2022-02-23 DIAGNOSIS — M2012 Hallux valgus (acquired), left foot: Secondary | ICD-10-CM | POA: Diagnosis not present

## 2022-02-23 DIAGNOSIS — S99922A Unspecified injury of left foot, initial encounter: Secondary | ICD-10-CM

## 2022-02-27 ENCOUNTER — Encounter: Payer: Self-pay | Admitting: Podiatry

## 2022-02-27 NOTE — Progress Notes (Signed)
  Subjective:  Patient ID: Megan Craig, female    DOB: August 30, 1945,  MRN: [id number]  Chief Complaint  Patient presents with   Bunions    f/u from left foot surgery (new x-rays).     77 y.o. female returns for post-op check.  She is doing well, she is ambulating in regular shoes not having the pain wears a toe spacer  Review of Systems: Negative except as noted in the HPI. Denies N/V/F/Ch.   Objective:  There were no vitals filed for this visit. There is no height or weight on file to calculate BMI. Constitutional Well developed. Well nourished.  Vascular Foot warm and well perfused. Capillary refill normal to all digits.  Calf is soft and supple, no posterior calf or knee pain, negative Homans' sign  Neurologic Normal speech. Oriented to person, place, and time. Epicritic sensation to light touch grossly present bilaterally.  Dermatologic Incisions are well-healed not hypertrophic  Orthopedic: No pain to palpation good range of motion, slight recurrent of valgus deformity nonpainful, good stability of toe   Multiple view plain film radiographs: Increased consolidation across osteotomy site, there is no change in alignment of the metatarsal slight increase in the hallux abductus Assessment:   1. Hallux valgus with bunions, left   2. Hammertoe of left foot   3. Injury of plantar plate of left foot, initial encounter    Plan:  Patient was evaluated and treated and all questions answered.  S/p foot surgery left -Doing well may continue regular shoe gear and activity.  She will return to see me as needed if this worsens.  We discussed recurrence of the deformity but it is nonpainful and controlled with a toe spacer.  No follow-ups on file.

## 2022-03-29 DIAGNOSIS — H5202 Hypermetropia, left eye: Secondary | ICD-10-CM | POA: Diagnosis not present

## 2022-07-28 DIAGNOSIS — M8580 Other specified disorders of bone density and structure, unspecified site: Secondary | ICD-10-CM | POA: Diagnosis not present

## 2022-07-28 DIAGNOSIS — Z0000 Encounter for general adult medical examination without abnormal findings: Secondary | ICD-10-CM | POA: Diagnosis not present

## 2022-07-28 DIAGNOSIS — F339 Major depressive disorder, recurrent, unspecified: Secondary | ICD-10-CM | POA: Diagnosis not present

## 2022-07-28 DIAGNOSIS — E039 Hypothyroidism, unspecified: Secondary | ICD-10-CM | POA: Diagnosis not present

## 2022-07-28 DIAGNOSIS — K219 Gastro-esophageal reflux disease without esophagitis: Secondary | ICD-10-CM | POA: Diagnosis not present

## 2022-07-28 DIAGNOSIS — E7800 Pure hypercholesterolemia, unspecified: Secondary | ICD-10-CM | POA: Diagnosis not present

## 2022-11-03 DIAGNOSIS — K219 Gastro-esophageal reflux disease without esophagitis: Secondary | ICD-10-CM | POA: Diagnosis not present

## 2022-12-08 DIAGNOSIS — Z1231 Encounter for screening mammogram for malignant neoplasm of breast: Secondary | ICD-10-CM | POA: Diagnosis not present

## 2023-01-29 DIAGNOSIS — E7800 Pure hypercholesterolemia, unspecified: Secondary | ICD-10-CM | POA: Diagnosis not present

## 2023-04-10 DIAGNOSIS — H52223 Regular astigmatism, bilateral: Secondary | ICD-10-CM | POA: Diagnosis not present

## 2023-07-07 DIAGNOSIS — E039 Hypothyroidism, unspecified: Secondary | ICD-10-CM | POA: Diagnosis not present

## 2023-07-07 DIAGNOSIS — E7800 Pure hypercholesterolemia, unspecified: Secondary | ICD-10-CM | POA: Diagnosis not present

## 2023-07-07 DIAGNOSIS — F339 Major depressive disorder, recurrent, unspecified: Secondary | ICD-10-CM | POA: Diagnosis not present

## 2023-07-30 DIAGNOSIS — Z79899 Other long term (current) drug therapy: Secondary | ICD-10-CM | POA: Diagnosis not present

## 2023-07-30 DIAGNOSIS — E039 Hypothyroidism, unspecified: Secondary | ICD-10-CM | POA: Diagnosis not present

## 2023-08-01 DIAGNOSIS — L570 Actinic keratosis: Secondary | ICD-10-CM | POA: Diagnosis not present

## 2023-08-01 DIAGNOSIS — K130 Diseases of lips: Secondary | ICD-10-CM | POA: Diagnosis not present

## 2023-09-06 DIAGNOSIS — F339 Major depressive disorder, recurrent, unspecified: Secondary | ICD-10-CM | POA: Diagnosis not present

## 2023-09-06 DIAGNOSIS — E039 Hypothyroidism, unspecified: Secondary | ICD-10-CM | POA: Diagnosis not present

## 2023-09-06 DIAGNOSIS — E7800 Pure hypercholesterolemia, unspecified: Secondary | ICD-10-CM | POA: Diagnosis not present

## 2023-10-07 DIAGNOSIS — E039 Hypothyroidism, unspecified: Secondary | ICD-10-CM | POA: Diagnosis not present

## 2023-10-07 DIAGNOSIS — F339 Major depressive disorder, recurrent, unspecified: Secondary | ICD-10-CM | POA: Diagnosis not present

## 2023-10-07 DIAGNOSIS — E7800 Pure hypercholesterolemia, unspecified: Secondary | ICD-10-CM | POA: Diagnosis not present

## 2023-11-06 DIAGNOSIS — F339 Major depressive disorder, recurrent, unspecified: Secondary | ICD-10-CM | POA: Diagnosis not present

## 2023-11-06 DIAGNOSIS — E7800 Pure hypercholesterolemia, unspecified: Secondary | ICD-10-CM | POA: Diagnosis not present

## 2023-11-06 DIAGNOSIS — E039 Hypothyroidism, unspecified: Secondary | ICD-10-CM | POA: Diagnosis not present

## 2023-12-07 DIAGNOSIS — E7800 Pure hypercholesterolemia, unspecified: Secondary | ICD-10-CM | POA: Diagnosis not present

## 2023-12-07 DIAGNOSIS — E039 Hypothyroidism, unspecified: Secondary | ICD-10-CM | POA: Diagnosis not present

## 2023-12-07 DIAGNOSIS — F339 Major depressive disorder, recurrent, unspecified: Secondary | ICD-10-CM | POA: Diagnosis not present

## 2023-12-14 DIAGNOSIS — Z1231 Encounter for screening mammogram for malignant neoplasm of breast: Secondary | ICD-10-CM | POA: Diagnosis not present
